# Patient Record
Sex: Male | Born: 1950 | Race: White | Hispanic: No | Marital: Married | Smoking: Former smoker
Health system: Southern US, Community
[De-identification: ages and names within clinical notes are randomized; demographics above are authoritative.]

## PROBLEM LIST (undated history)

## (undated) DIAGNOSIS — F329 Major depressive disorder, single episode, unspecified: Secondary | ICD-10-CM

## (undated) DIAGNOSIS — N529 Male erectile dysfunction, unspecified: Secondary | ICD-10-CM

## (undated) DIAGNOSIS — G4730 Sleep apnea, unspecified: Secondary | ICD-10-CM

## (undated) DIAGNOSIS — R001 Bradycardia, unspecified: Secondary | ICD-10-CM

## (undated) DIAGNOSIS — F419 Anxiety disorder, unspecified: Secondary | ICD-10-CM

## (undated) DIAGNOSIS — G9001 Carotid sinus syncope: Secondary | ICD-10-CM

## (undated) DIAGNOSIS — IMO0002 Reserved for concepts with insufficient information to code with codable children: Secondary | ICD-10-CM

## (undated) DIAGNOSIS — I10 Essential (primary) hypertension: Secondary | ICD-10-CM

## (undated) DIAGNOSIS — N2889 Other specified disorders of kidney and ureter: Secondary | ICD-10-CM

## (undated) DIAGNOSIS — F32A Depression, unspecified: Secondary | ICD-10-CM

## (undated) DIAGNOSIS — Z950 Presence of cardiac pacemaker: Secondary | ICD-10-CM

## (undated) DIAGNOSIS — C801 Malignant (primary) neoplasm, unspecified: Secondary | ICD-10-CM

## (undated) DIAGNOSIS — K219 Gastro-esophageal reflux disease without esophagitis: Secondary | ICD-10-CM

## (undated) DIAGNOSIS — N393 Stress incontinence (female) (male): Secondary | ICD-10-CM

## (undated) DIAGNOSIS — C61 Malignant neoplasm of prostate: Secondary | ICD-10-CM

## (undated) DIAGNOSIS — E785 Hyperlipidemia, unspecified: Secondary | ICD-10-CM

## (undated) DIAGNOSIS — M1990 Unspecified osteoarthritis, unspecified site: Secondary | ICD-10-CM

## (undated) DIAGNOSIS — T7840XA Allergy, unspecified, initial encounter: Secondary | ICD-10-CM

## (undated) HISTORY — DX: Anxiety disorder, unspecified: F41.9

## (undated) HISTORY — DX: Malignant (primary) neoplasm, unspecified: C80.1

## (undated) HISTORY — DX: Presence of cardiac pacemaker: Z95.0

## (undated) HISTORY — DX: Hyperlipidemia, unspecified: E78.5

## (undated) HISTORY — DX: Other specified disorders of kidney and ureter: N28.89

## (undated) HISTORY — DX: Allergy, unspecified, initial encounter: T78.40XA

## (undated) HISTORY — DX: Stress incontinence (female) (male): N39.3

## (undated) HISTORY — DX: Bradycardia, unspecified: R00.1

## (undated) HISTORY — DX: Unspecified osteoarthritis, unspecified site: M19.90

## (undated) HISTORY — DX: Male erectile dysfunction, unspecified: N52.9

## (undated) HISTORY — DX: Carotid sinus syncope: G90.01

## (undated) HISTORY — DX: Depression, unspecified: F32.A

## (undated) HISTORY — DX: Sleep apnea, unspecified: G47.30

## (undated) HISTORY — DX: Essential (primary) hypertension: I10

## (undated) HISTORY — DX: Gastro-esophageal reflux disease without esophagitis: K21.9

## (undated) HISTORY — DX: Reserved for concepts with insufficient information to code with codable children: IMO0002

## (undated) HISTORY — PX: PENILE PROSTHESIS IMPLANT: SHX240

## (undated) HISTORY — PX: COLONOSCOPY: SHX174

## (undated) HISTORY — DX: Malignant neoplasm of prostate: C61

## (undated) HISTORY — PX: POLYPECTOMY: SHX149

## (undated) HISTORY — DX: Major depressive disorder, single episode, unspecified: F32.9

## (undated) HISTORY — PX: PROSTATECTOMY: SHX69

---

## 2000-10-03 HISTORY — PX: SHOULDER SURGERY: SHX246

## 2001-07-10 ENCOUNTER — Encounter (INDEPENDENT_AMBULATORY_CARE_PROVIDER_SITE_OTHER): Payer: Self-pay

## 2001-07-10 ENCOUNTER — Other Ambulatory Visit: Admission: RE | Admit: 2001-07-10 | Discharge: 2001-07-10 | Payer: Self-pay | Admitting: Gastroenterology

## 2002-11-23 ENCOUNTER — Ambulatory Visit (HOSPITAL_BASED_OUTPATIENT_CLINIC_OR_DEPARTMENT_OTHER): Admission: RE | Admit: 2002-11-23 | Discharge: 2002-11-23 | Payer: Self-pay | Admitting: Neurology

## 2003-01-30 ENCOUNTER — Ambulatory Visit (HOSPITAL_BASED_OUTPATIENT_CLINIC_OR_DEPARTMENT_OTHER): Admission: RE | Admit: 2003-01-30 | Discharge: 2003-01-30 | Payer: Self-pay | Admitting: *Deleted

## 2004-03-13 ENCOUNTER — Emergency Department (HOSPITAL_COMMUNITY): Admission: EM | Admit: 2004-03-13 | Discharge: 2004-03-13 | Payer: Self-pay | Admitting: Emergency Medicine

## 2007-09-09 ENCOUNTER — Emergency Department (HOSPITAL_COMMUNITY): Admission: EM | Admit: 2007-09-09 | Discharge: 2007-09-09 | Payer: Self-pay | Admitting: *Deleted

## 2007-09-21 ENCOUNTER — Encounter: Admission: RE | Admit: 2007-09-21 | Discharge: 2007-09-21 | Payer: Self-pay | Admitting: Emergency Medicine

## 2007-10-04 HISTORY — PX: OTHER SURGICAL HISTORY: SHX169

## 2008-10-03 HISTORY — PX: ROTATOR CUFF REPAIR: SHX139

## 2009-03-11 ENCOUNTER — Encounter: Admission: RE | Admit: 2009-03-11 | Discharge: 2009-06-09 | Payer: Self-pay | Admitting: Orthopedic Surgery

## 2009-08-22 ENCOUNTER — Inpatient Hospital Stay (HOSPITAL_COMMUNITY): Admission: EM | Admit: 2009-08-22 | Discharge: 2009-08-25 | Payer: Self-pay | Admitting: Emergency Medicine

## 2009-11-07 ENCOUNTER — Emergency Department (HOSPITAL_COMMUNITY): Admission: EM | Admit: 2009-11-07 | Discharge: 2009-11-08 | Payer: Self-pay | Admitting: Emergency Medicine

## 2010-12-13 ENCOUNTER — Other Ambulatory Visit: Payer: Self-pay | Admitting: Family Medicine

## 2010-12-14 ENCOUNTER — Ambulatory Visit
Admission: RE | Admit: 2010-12-14 | Discharge: 2010-12-14 | Disposition: A | Discharge status: Home / Self Care | Payer: PRIVATE HEALTH INSURANCE | Source: Ambulatory Visit | Attending: Family Medicine | Admitting: Family Medicine

## 2010-12-14 ENCOUNTER — Other Ambulatory Visit: Payer: Self-pay | Admitting: Family Medicine

## 2010-12-14 DIAGNOSIS — R229 Localized swelling, mass and lump, unspecified: Secondary | ICD-10-CM

## 2010-12-23 LAB — DIFFERENTIAL
Basophils Relative: 0 % (ref 0–1)
Eosinophils Absolute: 0.4 10*3/uL (ref 0.0–0.7)
Eosinophils Relative: 4 % (ref 0–5)
Lymphocytes Relative: 27 % (ref 12–46)
Monocytes Absolute: 0.5 10*3/uL (ref 0.1–1.0)
Neutrophils Relative %: 63 % (ref 43–77)

## 2010-12-23 LAB — COMPREHENSIVE METABOLIC PANEL
ALT: 23 U/L (ref 0–53)
Albumin: 4.6 g/dL (ref 3.5–5.2)
Alkaline Phosphatase: 56 U/L (ref 39–117)
Calcium: 9.3 mg/dL (ref 8.4–10.5)
Glucose, Bld: 133 mg/dL — ABNORMAL HIGH (ref 70–99)
Total Protein: 7.2 g/dL (ref 6.0–8.3)

## 2010-12-23 LAB — URINALYSIS, ROUTINE W REFLEX MICROSCOPIC
Glucose, UA: NEGATIVE mg/dL
Hgb urine dipstick: NEGATIVE
Nitrite: NEGATIVE
Specific Gravity, Urine: 1.009 (ref 1.005–1.030)

## 2010-12-23 LAB — CBC
Hemoglobin: 14.9 g/dL (ref 13.0–17.0)
RBC: 4.85 MIL/uL (ref 4.22–5.81)
WBC: 9.6 10*3/uL (ref 4.0–10.5)

## 2010-12-23 LAB — POCT CARDIAC MARKERS: Troponin i, poc: <0.05 ng/mL (ref 0.00–0.09)

## 2010-12-23 LAB — URINE CULTURE: Colony Count: 6000

## 2010-12-23 LAB — LIPASE, BLOOD: Lipase: 25 U/L (ref 11–59)

## 2010-12-23 LAB — PROTIME-INR: Prothrombin Time: 12.8 seconds (ref 11.6–15.2)

## 2011-01-05 LAB — COMPREHENSIVE METABOLIC PANEL
AST: 21 U/L (ref 0–37)
CO2: 22 mEq/L (ref 19–32)
Calcium: 8.6 mg/dL (ref 8.4–10.5)
Creatinine, Ser: 1 mg/dL (ref 0.4–1.5)
GFR calc Af Amer: >60 mL/min (ref >60)
GFR calc non Af Amer: >60 mL/min (ref >60)

## 2011-01-05 LAB — GLUCOSE, CAPILLARY
Glucose-Capillary: 123 mg/dL — ABNORMAL HIGH (ref 70–99)
Glucose-Capillary: 130 mg/dL — ABNORMAL HIGH (ref 70–99)
Glucose-Capillary: 149 mg/dL — ABNORMAL HIGH (ref 70–99)
Glucose-Capillary: 160 mg/dL — ABNORMAL HIGH (ref 70–99)
Glucose-Capillary: 205 mg/dL — ABNORMAL HIGH (ref 70–99)
Glucose-Capillary: 81 mg/dL (ref 70–99)

## 2011-01-05 LAB — POCT CARDIAC MARKERS

## 2011-01-05 LAB — BASIC METABOLIC PANEL
CO2: 25 mEq/L (ref 19–32)
Calcium: 8.9 mg/dL (ref 8.4–10.5)
Chloride: 103 mEq/L (ref 96–112)
GFR calc Af Amer: >60 mL/min (ref >60)
Glucose, Bld: 136 mg/dL — ABNORMAL HIGH (ref 70–99)
Sodium: 136 mEq/L (ref 135–145)

## 2011-01-05 LAB — RAPID URINE DRUG SCREEN, HOSP PERFORMED
Barbiturates: NOT DETECTED
Benzodiazepines: NOT DETECTED
Cocaine: NOT DETECTED

## 2011-01-05 LAB — DIFFERENTIAL
Basophils Absolute: 0 10*3/uL (ref 0.0–0.1)
Eosinophils Absolute: 0.4 10*3/uL (ref 0.0–0.7)
Eosinophils Relative: 5 % (ref 0–5)
Monocytes Relative: 9 % (ref 3–12)
Neutrophils Relative %: 48 % (ref 43–77)

## 2011-01-05 LAB — CBC
HCT: 40.3 % (ref 39.0–52.0)
HCT: 41.8 % (ref 39.0–52.0)
Hemoglobin: 13.9 g/dL (ref 13.0–17.0)
Hemoglobin: 14.4 g/dL (ref 13.0–17.0)
MCHC: 34.4 g/dL (ref 30.0–36.0)
MCHC: 34.5 g/dL (ref 30.0–36.0)
MCV: 90.3 fL (ref 78.0–100.0)
MCV: 90.6 fL (ref 78.0–100.0)
MCV: 90.7 fL (ref 78.0–100.0)
Platelets: 192 10*3/uL (ref 150–400)
Platelets: 200 10*3/uL (ref 150–400)
Platelets: 202 10*3/uL (ref 150–400)
RBC: 4.36 MIL/uL (ref 4.22–5.81)
RBC: 4.63 MIL/uL (ref 4.22–5.81)
RBC: 4.67 MIL/uL (ref 4.22–5.81)
RDW: 12.8 % (ref 11.5–15.5)
RDW: 12.9 % (ref 11.5–15.5)
WBC: 7.9 10*3/uL (ref 4.0–10.5)
WBC: 8.1 10*3/uL (ref 4.0–10.5)

## 2011-01-05 LAB — HEPARIN LEVEL (UNFRACTIONATED)
Heparin Unfractionated: 0.1 IU/mL — ABNORMAL LOW (ref 0.30–0.70)
Heparin Unfractionated: 0.53 IU/mL (ref 0.30–0.70)
Heparin Unfractionated: 0.56 IU/mL (ref 0.30–0.70)
Heparin Unfractionated: 0.97 IU/mL — ABNORMAL HIGH (ref 0.30–0.70)
Heparin Unfractionated: <0.1 IU/mL — ABNORMAL LOW (ref 0.30–0.70)

## 2011-01-05 LAB — CARDIAC PANEL(CRET KIN+CKTOT+MB+TROPI)
CK, MB: 0.7 ng/mL (ref 0.3–4.0)
CK, MB: 0.7 ng/mL (ref 0.3–4.0)
CK, MB: 0.8 ng/mL (ref 0.3–4.0)
Relative Index: INVALID (ref 0.0–2.5)
Total CK: 61 U/L (ref 7–232)
Total CK: 65 U/L (ref 7–232)
Total CK: 67 U/L (ref 7–232)
Troponin I: 0.01 ng/mL (ref 0.00–0.06)
Troponin I: 0.01 ng/mL (ref 0.00–0.06)
Troponin I: 0.02 ng/mL (ref 0.00–0.06)

## 2011-01-05 LAB — LIPID PANEL
HDL: 40 mg/dL (ref >39)
LDL Cholesterol: 95 mg/dL (ref 0–99)
Triglycerides: 383 mg/dL — ABNORMAL HIGH (ref <150)

## 2011-01-05 LAB — POCT I-STAT, CHEM 8
BUN: 11 mg/dL (ref 6–23)
Calcium, Ion: 1.12 mmol/L (ref 1.12–1.32)
Creatinine, Ser: 0.9 mg/dL (ref 0.4–1.5)
Glucose, Bld: 215 mg/dL — ABNORMAL HIGH (ref 70–99)
TCO2: 22 mmol/L (ref 0–100)

## 2011-01-05 LAB — PROTIME-INR: INR: 0.9 (ref 0.00–1.49)

## 2011-01-05 LAB — CK TOTAL AND CKMB (NOT AT ARMC)
CK, MB: 0.9 ng/mL (ref 0.3–4.0)
Relative Index: INVALID (ref 0.0–2.5)
Total CK: 75 U/L (ref 7–232)

## 2011-01-05 LAB — TSH: TSH: 2.685 u[IU]/mL (ref 0.350–4.500)

## 2011-01-05 LAB — TROPONIN I: Troponin I: <0.01 ng/mL (ref 0.00–0.06)

## 2011-03-30 ENCOUNTER — Emergency Department (HOSPITAL_COMMUNITY): Payer: PRIVATE HEALTH INSURANCE

## 2011-03-30 ENCOUNTER — Inpatient Hospital Stay (HOSPITAL_COMMUNITY)
Admission: EM | Admit: 2011-03-30 | Discharge: 2011-04-01 | DRG: 244 | Disposition: A | Discharge status: Home / Self Care | Payer: PRIVATE HEALTH INSURANCE | Attending: Cardiovascular Disease | Admitting: Cardiovascular Disease

## 2011-03-30 DIAGNOSIS — E785 Hyperlipidemia, unspecified: Secondary | ICD-10-CM | POA: Diagnosis present

## 2011-03-30 DIAGNOSIS — E7800 Pure hypercholesterolemia, unspecified: Secondary | ICD-10-CM | POA: Diagnosis present

## 2011-03-30 DIAGNOSIS — F329 Major depressive disorder, single episode, unspecified: Secondary | ICD-10-CM | POA: Diagnosis present

## 2011-03-30 DIAGNOSIS — I2510 Atherosclerotic heart disease of native coronary artery without angina pectoris: Secondary | ICD-10-CM | POA: Diagnosis present

## 2011-03-30 DIAGNOSIS — G9001 Carotid sinus syncope: Secondary | ICD-10-CM | POA: Diagnosis present

## 2011-03-30 DIAGNOSIS — I10 Essential (primary) hypertension: Secondary | ICD-10-CM | POA: Diagnosis present

## 2011-03-30 DIAGNOSIS — F3289 Other specified depressive episodes: Secondary | ICD-10-CM | POA: Diagnosis present

## 2011-03-30 DIAGNOSIS — I498 Other specified cardiac arrhythmias: Principal | ICD-10-CM | POA: Diagnosis present

## 2011-03-30 DIAGNOSIS — E119 Type 2 diabetes mellitus without complications: Secondary | ICD-10-CM | POA: Diagnosis present

## 2011-03-30 DIAGNOSIS — R112 Nausea with vomiting, unspecified: Secondary | ICD-10-CM | POA: Diagnosis present

## 2011-03-30 DIAGNOSIS — I455 Other specified heart block: Secondary | ICD-10-CM | POA: Diagnosis present

## 2011-03-30 DIAGNOSIS — R55 Syncope and collapse: Secondary | ICD-10-CM | POA: Diagnosis present

## 2011-03-30 LAB — CK TOTAL AND CKMB (NOT AT ARMC)
CK, MB: 1.4 ng/mL (ref 0.3–4.0)
Relative Index: INVALID (ref 0.0–2.5)

## 2011-03-30 LAB — COMPREHENSIVE METABOLIC PANEL
AST: 18 U/L (ref 0–37)
Albumin: 4.2 g/dL (ref 3.5–5.2)
Alkaline Phosphatase: 53 U/L (ref 39–117)
BUN: 12 mg/dL (ref 6–23)
Chloride: 97 mEq/L (ref 96–112)
Potassium: 3.8 mEq/L (ref 3.5–5.1)
Sodium: 132 mEq/L — ABNORMAL LOW (ref 135–145)
Total Bilirubin: 0.3 mg/dL (ref 0.3–1.2)
Total Protein: 7.4 g/dL (ref 6.0–8.3)

## 2011-03-30 LAB — CBC
HCT: 42.6 % (ref 39.0–52.0)
Hemoglobin: 15.2 g/dL (ref 13.0–17.0)
MCV: 85.9 fL (ref 78.0–100.0)
RBC: 4.96 MIL/uL (ref 4.22–5.81)
RDW: 12.6 % (ref 11.5–15.5)
WBC: 12.3 10*3/uL — ABNORMAL HIGH (ref 4.0–10.5)

## 2011-03-30 LAB — GLUCOSE, CAPILLARY: Glucose-Capillary: 156 mg/dL — ABNORMAL HIGH (ref 70–99)

## 2011-03-30 LAB — TROPONIN I: Troponin I: <0.3 ng/mL (ref <0.30)

## 2011-03-30 LAB — MRSA PCR SCREENING: MRSA by PCR: NEGATIVE

## 2011-03-30 LAB — LIPASE, BLOOD: Lipase: 30 U/L (ref 11–59)

## 2011-03-31 HISTORY — PX: PACEMAKER INSERTION: SHX728

## 2011-03-31 LAB — BASIC METABOLIC PANEL
BUN: 8 mg/dL (ref 6–23)
Calcium: 7.9 mg/dL — ABNORMAL LOW (ref 8.4–10.5)
Chloride: 104 mEq/L (ref 96–112)
Creatinine, Ser: 0.8 mg/dL (ref 0.50–1.35)
GFR calc Af Amer: >60 mL/min (ref >60)

## 2011-03-31 LAB — CBC
HCT: 41 % (ref 39.0–52.0)
Platelets: 234 10*3/uL (ref 150–400)
RBC: 4.73 MIL/uL (ref 4.22–5.81)
RDW: 12.7 % (ref 11.5–15.5)
WBC: 11.8 10*3/uL — ABNORMAL HIGH (ref 4.0–10.5)

## 2011-03-31 LAB — PROTIME-INR: INR: 0.97 (ref 0.00–1.49)

## 2011-03-31 LAB — GLUCOSE, CAPILLARY

## 2011-04-01 ENCOUNTER — Inpatient Hospital Stay (HOSPITAL_COMMUNITY): Payer: PRIVATE HEALTH INSURANCE

## 2011-04-01 LAB — GLUCOSE, CAPILLARY: Glucose-Capillary: 161 mg/dL — ABNORMAL HIGH (ref 70–99)

## 2011-04-09 NOTE — Op Note (Signed)
Nathan Harding, ANTHES NO.:  [id number]  MEDICAL RECORD NO.:  [id number]  LOCATION:  2008                         FACILITY:  [facility]  PHYSICIAN:  Thurmon Fair, MD     DATE OF BIRTH:  1950/12/14  DATE OF PROCEDURE: DATE OF DISCHARGE:                              OPERATIVE REPORT   PROCEDURES PERFORMED: 1. Implantation of new dual-chamber permanent pacemaker. 2. Fluoroscopy. 3. Moderate sedation.  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 Marked bradycardia is easily reproduced by gentle palpation of the right carotid sinus.  He experienced recurrent syncope prior to this admission.  After risks and benefits of the procedure were described, the patient provided informed consent and was brought to the cardiac cath lab in a fasting state.  He was prepped and draped in usual sterile fashion. Local anesthesia with 1% lidocaine was administered to the left prepectoral area.  A 6-cm horizontal incision was made parallel to and roughly 3-4 cm inferior to the lower border of the left clavicle.  Using electrocautery and blunt dissection, a prepectoral pocket was carefully created with some methodical attention to providing excellent hemostasis.  Antibiotic-soaked sponge was placed in the pocket.  Under fluoroscopic guidance and using the modified Seldinger technique, 2 separate J-tipped guidewires were placed in the left subclavian vein and then exchanged for two 8-French safe sheaths.  The left ventricular lead was advanced to the level of the mid apical right ventricular septum.  The active fixation helix was deployed, but there was very poor current of injury.  A second attempt in the right ventricular apex did not provide adequate sensing and sensing parameters or current of injury.  Finally, a third location again in the mid-to- apical septal area appeared to  provide the best electronic parameters. The active fixation helix was deployed.  There was prominent current of injury.  There was a satisfactory sensing and pacing and impedance values.  Pacing at maximum device output did not produce any diaphragmatic/phrenic nerve stimulation.  The safe sheath was peeled away and the lead was secured in place using 2-0 silk.  In a similar fashion, the right atrial lead was advanced to the level of the appendage and the active fixation helix was deployed.  Prominent current of injury was seen.  There was good sensing, pacing, and impedance parameters.  The safe sheath was peeled away and the lead was secured in place using 2-0 silk.  The antibiotic-soaked sponge was removed from the pocket and the pocket was then flushed with copious amounts of antibiotic solution and reinspected for hemostasis.  The pacemaker generator was then connected to the leads and appropriate ventricular and subsequently atrioventricular sequential pacing were noted.  The generator and leads were placed in the pocket with great care being taken that the generator be located superficial to the leads.  The pocket was then closed in layers using 2 layers of 2-0 Vicryl as well as cutaneous staples, after which, a sterile dressing was applied.  No immediate complications occurred.  The estimated blood loss was less than 10 mL.  DEVICE DETAILS:  The device is  a Medtronic MRI safe Revo, model number RVDRO1, serial number [id number].  The right atrial lead is a Medtronic 5086 - 52 cm MRI safe lead, serial number [id number].  The ventricular lead is a Medtronic 5086 MRI safe - 58 cm lead, serial number [id number].  At the end of the procedure, the following electrical parameters were encountered.  Atrial lead sensed P-waves 2.1 mV, impedance 1013 ohms, threshold 1.5 V at 0.5 milliseconds pulse width.  By the retesting at the end of the procedure, threshold had dropped  to 1 volt at 0.4 milliseconds pulse width.  Right ventricular lead sensed R-waves were 12.4 mV, impedance 1280 ohms, threshold 1.5 V at 0.5 milliseconds pulse width.  By the end of the procedure, the threshold had dropped to 1 V at 0.4 milliseconds pulse width.  Medications administered in the procedure were Ancef 1 g intravenously, lidocaine 1% 30 mL locally, Versed 3 mg, fentanyl 50 mcg, and Zofran 4 mg intravenously.     Thurmon Fair, MD     MC/MEDQ  D:  03/31/2011  T:  04/01/2011  Job:  [id number]  cc:   Nanetta Batty, M.D. [facility]  Electronically Signed by Thurmon Fair M.D. on 04/09/2011 08:18:21 AM

## 2011-04-27 NOTE — Discharge Summary (Signed)
Nathan, [name]:  [id number]  MEDICAL RECORD NO.:  [id number]  LOCATION:  2008                         FACILITY:  [facility]  PHYSICIAN:  Nanetta Batty, M.D.   DATE OF BIRTH:  [birth year]  DATE OF ADMISSION:  03/30/2011 DATE OF DISCHARGE:  04/01/2011                              DISCHARGE SUMMARY   DISCHARGE DIAGNOSES: 1. Syncope secondary to severe bradycardia, sinus arrest with very     slow ventricular escape rhythm.     a.     Placement of a Medtronic permanent transvenous pacemaker      dual chamber to the left subclavian area on March 31, 2011.     b.     History of similar episode in 2010, but no documented      arrhythmias at that time were noted. 2. Carotid sinus hypersensitivity. 3. Diabetes mellitus 2, controlled. 4. Nonobstructive coronary artery disease in 2010. 5. Hypertension. 6. Dyslipidemia. 7. A 2-component friction rub.  A 2-D echo stable.  No pericardial     effusions.  DISCHARGE CONDITION:  Improved.  PROCEDURES:  Placement of Medtronic pacemaker dual chamber on March 31, 2011, by Dr. Rachelle Hora Croitoru.  During the procedure, it was noted the patient with carotid sinus compression produced immediate and dramatic bradycardia compatible with carotid sinus hypersensitivity.  DISCHARGE MEDICATIONS:  See medication reconciliation sheet.  DISCHARGE INSTRUCTIONS: 1. Increase activity slowly.  See pacemaker sheet for further     instructions. 2. Return to work after you see Dr. Royann Shivers. 3. Low-sodium, heart-healthy, diabetic diet. 4. Follow up with Dr. Royann Shivers for pacer site check on April 08, 2011,     at 2:45 p.m.  HOSPITAL COURSE:  A 60 year old white married male with history of normal cath in 2010 but presented with similar symptoms, but no monitoring was done initially.  Presented to the emergency room on March 30, 2011, with syncope.  He had no chest pain.  He called 911.  In the ER by ambulance, there were several episodes  associated with asystole and profound sinus brady.  He responded to IV atropine.  He was placed on IV dobutamine to keep his heart rate at a normal rate.  He was admitted to the intensive care unit for close monitoring.  ZOLL pads were applied.  Atropine at bedside.  He was seen by Dr. Royann Shivers on March 31, 2011, and placement of permanent transvenous pacemaker was completed without complications.  By the next morning, pacer site interrogation was within normal.  His chest x-ray showed no pneumothorax, but on exam, he had a 2-component friction rub.  A 2-D echo was done, but there was no pericardial effusion.  The patient ambulated in the hall and was ready for discharge.  LABORATORY VALUES:  Sodium 139, potassium 4.2, BUN 8, creatinine 0.80, glucose 155.  Hemoglobin 14.4, hematocrit 41, platelets 234, and WBC 11.8.  Protime 13.1, INR 0.97, total protein was 7.4, albumin 4.2, AST 18, ALT 22, alkaline phos was 53, total bili 0.3, lipase 30.  Cardiac enzymes, CK 61, MB 1.4, troponin I less than 0.30, and MRSA screen was negative.  Chest x-ray, a dual lead pacemaker placed via left subclavian with  lead tips overlying the right atrium and right ventricular contours.  Low lung volumes are noted.  Heart and mediastinal contours are stable. Heart size in upper lip limits of normal, but overall improvement in aeration is noted with bibasilar subsegmental atelectases.  Lung fields are clear with no sign of focal infiltrate or congestive failure.  No pleural fluid or peribronchial cuffing is noted.  A 2-D echo, EF 55-65%.  This was done on April 01, 2011.  There was some abnormal left ventricular relaxation, grade 1 diastolic dysfunction, mild pulmonic regurg, and there was a prominent pericardial fat pad extracardiac, but no pericardial effusions.  EKGs, sinus rhythm and normal P-waves.  The patient will follow up as instructed.     Darcella Gasman. Annie Paras,  N.P.   ______________________________ Nanetta Batty, M.D.    LRI/MEDQ  D:  04/07/2011  T:  04/08/2011  Job:  [id number]  cc:   Darrow Bussing, MD  Electronically Signed by Nada Boozer N.P. on 04/08/2011 04:32:45 PM Electronically Signed by Nanetta Batty M.D. on 04/27/2011 03:17:47 PM

## 2011-07-11 LAB — COMPREHENSIVE METABOLIC PANEL
Alkaline Phosphatase: 70
BUN: 16
Chloride: 99
Glucose, Bld: 211 — ABNORMAL HIGH
Potassium: 4.9
Total Bilirubin: 0.8

## 2011-07-11 LAB — DIFFERENTIAL
Basophils Absolute: 0
Basophils Relative: 0
Neutro Abs: 13.3 — ABNORMAL HIGH
Neutrophils Relative %: 91 — ABNORMAL HIGH

## 2011-07-11 LAB — CBC
HCT: 49.2
Hemoglobin: 16.9
RDW: 12.9
WBC: 14.7 — ABNORMAL HIGH

## 2011-07-11 LAB — KETONES, QUALITATIVE: Acetone, Bld: NEGATIVE

## 2011-07-25 ENCOUNTER — Encounter: Payer: Self-pay | Admitting: Gastroenterology

## 2011-08-04 ENCOUNTER — Ambulatory Visit (AMBULATORY_SURGERY_CENTER): Payer: PRIVATE HEALTH INSURANCE | Admitting: *Deleted

## 2011-08-04 VITALS — Ht 70.0 in | Wt 217.5 lb

## 2011-08-04 DIAGNOSIS — Z1211 Encounter for screening for malignant neoplasm of colon: Secondary | ICD-10-CM

## 2011-08-04 MED ORDER — PEG-KCL-NACL-NASULF-NA ASC-C 100 G PO SOLR: 1.0000 | Freq: Once | ORAL | Status: DC

## 2011-08-05 ENCOUNTER — Encounter: Payer: Self-pay | Admitting: Gastroenterology

## 2011-08-17 ENCOUNTER — Encounter: Payer: Self-pay | Admitting: Gastroenterology

## 2011-08-17 ENCOUNTER — Ambulatory Visit (AMBULATORY_SURGERY_CENTER): Payer: PRIVATE HEALTH INSURANCE | Admitting: Gastroenterology

## 2011-08-17 VITALS — BP 129/83 | HR 78 | Temp 96.6°F | Resp 16 | Ht 70.0 in | Wt 217.0 lb

## 2011-08-17 DIAGNOSIS — D126 Benign neoplasm of colon, unspecified: Secondary | ICD-10-CM

## 2011-08-17 DIAGNOSIS — Z1211 Encounter for screening for malignant neoplasm of colon: Secondary | ICD-10-CM

## 2011-08-17 MED ORDER — SODIUM CHLORIDE 0.9 % IV SOLN: 500.0000 mL | INTRAVENOUS | Status: DC

## 2011-08-17 NOTE — Progress Notes (Signed)
Patient did not experience any of the following events: a burn prior to discharge; a fall within the facility; wrong site/side/patient/procedure/implant event; or a hospital transfer or hospital admission upon discharge from the facility. (G8907) Patient did not have preoperative order for IV antibiotic SSI prophylaxis. (G8918)  

## 2011-08-17 NOTE — Patient Instructions (Signed)
Green and blue discharge instructions reviewed with patient and care partner.  Impressions/recommendations:  Polyps (handout given)  Repeat colonoscopy in 3 years.  Resume medications as you were taking them prior to your procedure.

## 2011-08-18 ENCOUNTER — Telehealth: Payer: Self-pay

## 2011-08-18 NOTE — Telephone Encounter (Signed)
Left message on answering machine. 

## 2011-08-23 ENCOUNTER — Encounter: Payer: Self-pay | Admitting: Gastroenterology

## 2011-10-04 DIAGNOSIS — C61 Malignant neoplasm of prostate: Secondary | ICD-10-CM

## 2011-10-04 HISTORY — DX: Malignant neoplasm of prostate: C61

## 2011-12-13 ENCOUNTER — Emergency Department (HOSPITAL_COMMUNITY)
Admission: EM | Admit: 2011-12-13 | Discharge: 2011-12-13 | Disposition: A | Discharge status: Home Health | Payer: PRIVATE HEALTH INSURANCE | Attending: Emergency Medicine | Admitting: Emergency Medicine

## 2011-12-13 ENCOUNTER — Other Ambulatory Visit: Payer: Self-pay

## 2011-12-13 ENCOUNTER — Emergency Department (HOSPITAL_COMMUNITY): Payer: PRIVATE HEALTH INSURANCE

## 2011-12-13 ENCOUNTER — Encounter (HOSPITAL_COMMUNITY): Payer: Self-pay | Admitting: *Deleted

## 2011-12-13 DIAGNOSIS — E785 Hyperlipidemia, unspecified: Secondary | ICD-10-CM | POA: Insufficient documentation

## 2011-12-13 DIAGNOSIS — R109 Unspecified abdominal pain: Secondary | ICD-10-CM | POA: Insufficient documentation

## 2011-12-13 DIAGNOSIS — Z9889 Other specified postprocedural states: Secondary | ICD-10-CM | POA: Insufficient documentation

## 2011-12-13 DIAGNOSIS — E119 Type 2 diabetes mellitus without complications: Secondary | ICD-10-CM | POA: Insufficient documentation

## 2011-12-13 DIAGNOSIS — I10 Essential (primary) hypertension: Secondary | ICD-10-CM | POA: Insufficient documentation

## 2011-12-13 DIAGNOSIS — R1013 Epigastric pain: Secondary | ICD-10-CM | POA: Insufficient documentation

## 2011-12-13 DIAGNOSIS — R300 Dysuria: Secondary | ICD-10-CM | POA: Insufficient documentation

## 2011-12-13 DIAGNOSIS — G4730 Sleep apnea, unspecified: Secondary | ICD-10-CM | POA: Insufficient documentation

## 2011-12-13 DIAGNOSIS — K219 Gastro-esophageal reflux disease without esophagitis: Secondary | ICD-10-CM | POA: Insufficient documentation

## 2011-12-13 DIAGNOSIS — R55 Syncope and collapse: Secondary | ICD-10-CM

## 2011-12-13 LAB — COMPREHENSIVE METABOLIC PANEL
ALT: 17 U/L (ref 0–53)
Alkaline Phosphatase: 46 U/L (ref 39–117)
GFR calc Af Amer: 79 mL/min — ABNORMAL LOW (ref >90)
Glucose, Bld: 139 mg/dL — ABNORMAL HIGH (ref 70–99)
Potassium: 4.1 mEq/L (ref 3.5–5.1)
Sodium: 135 mEq/L (ref 135–145)
Total Protein: 6.6 g/dL (ref 6.0–8.3)

## 2011-12-13 LAB — CBC
MCH: 30.2 pg (ref 26.0–34.0)
Platelets: 239 10*3/uL (ref 150–400)
RBC: 4.21 MIL/uL — ABNORMAL LOW (ref 4.22–5.81)
WBC: 13.9 10*3/uL — ABNORMAL HIGH (ref 4.0–10.5)

## 2011-12-13 LAB — URINALYSIS, ROUTINE W REFLEX MICROSCOPIC
Glucose, UA: NEGATIVE mg/dL
Ketones, ur: 15 mg/dL — AB
Nitrite: NEGATIVE
pH: 5.5 (ref 5.0–8.0)

## 2011-12-13 LAB — DIFFERENTIAL
Eosinophils Absolute: 0.3 10*3/uL (ref 0.0–0.7)
Lymphocytes Relative: 18 % (ref 12–46)
Lymphs Abs: 2.5 10*3/uL (ref 0.7–4.0)
Neutrophils Relative %: 73 % (ref 43–77)

## 2011-12-13 MED ORDER — SODIUM CHLORIDE 0.9 % IV BOLUS (SEPSIS)
1000.0000 mL | Freq: Once | INTRAVENOUS | Status: AC
  Administered 2011-12-13: 1000 mL via INTRAVENOUS

## 2011-12-13 NOTE — Discharge Instructions (Signed)

## 2011-12-13 NOTE — ED Notes (Signed)
Pt was at home and had increased vesicare to twice a day.  Pt bent over and felt sharp pain in left upper abdomen and had near syncopal event and had been diaphoretic.  Pt states the pain radiated to mid upper abdomen and into chest and patient stated the pain was overwhelming.  PT bp has been in the 80s systolic and has had NS 400cc en route.  [id number].  Pt has a pacer that was placed for carotid sinus syndrome.

## 2011-12-13 NOTE — ED Provider Notes (Signed)
History     CSN: [id number]  Arrival date & time 12/13/11  Nathan Harding   First MD Initiated Contact with Patient 12/13/11 1822      Chief Complaint  Patient presents with  . Near Syncope  . Abdominal Pain    radiation to burning in the chest    (Consider location/radiation/quality/duration/timing/severity/associated sxs/prior treatment) Patient is a 61 y.o. male presenting with abdominal pain. The history is provided by the patient.  Abdominal Pain The primary symptoms of the illness include abdominal pain. The primary symptoms of the illness do not include shortness of breath, nausea or vomiting. The current episode started 3 to 5 hours ago. The onset of the illness was gradual. The problem has been resolved.  Associated with: Patient states the abdominal pain started when he restarted pick up something by his left foot. The pain started in the left mid abdomen and radiated to the epigastrium. It was severe lasting less than 5 minutes. The patient has not had a change in bowel habit. Symptoms associated with the illness do not include chills, heartburn or back pain. Associated symptoms comments: Patient does endorse dysuria. He is status post prostatectomy a month ago. Urinary catheter was removed on 2/21. Since then has had dysuria. He completed one week of Cipro a week ago. Was given another one time only antibiotic which he took today..    Past Medical History  Diagnosis Date  . Allergy   . Anxiety   . Arthritis   . Cancer     skin- Basal Cell CA  . Diabetes mellitus   . GERD (gastroesophageal reflux disease)   . Hyperlipidemia   . Hypertension   . Ulcer   . Sleep apnea     uses CPAP    Past Surgical History  Procedure Date  . Pacemaker insertion     due to hypersensitive carotid sinus syndrome  . Colonoscopy   . Temporal mastoidectomy     behind left ear  . Rotator cuff repair     right  . Shoulder surgery     right    Family History  Problem Relation Age of Onset    . Colon cancer Neg Hx   . Stomach cancer Neg Hx   . Esophageal cancer Cousin     History  Substance Use Topics  . Smoking status: Former Games developer  . Smokeless tobacco: Not on file  . Alcohol Use: 3.6 oz/week    6 Cans of beer per week     no drank in one month  -  updated 12/12/11      Review of Systems  Constitutional: Negative for chills.  Respiratory: Negative for shortness of breath.   Cardiovascular: Negative for chest pain.  Gastrointestinal: Positive for abdominal pain. Negative for heartburn, nausea and vomiting.  Musculoskeletal: Negative for back pain.  Neurological: Positive for dizziness and light-headedness. Negative for syncope.  All other systems reviewed and are negative.    Allergies  Review of patient's allergies indicates no known allergies.  Home Medications   Current Outpatient Rx  Name Route Sig Dispense Refill  . CARVEDILOL 6.25 MG PO TABS Oral Take 6.25 mg by mouth 2 (two) times daily with a meal.      . ESOMEPRAZOLE MAGNESIUM 40 MG PO CPDR Oral Take 40 mg by mouth daily before breakfast.    . FENOFIBRATE 145 MG PO TABS Oral Take 145 mg by mouth daily.      Marland Kitchen FEXOFENADINE HCL 180 MG PO TABS Oral  Take 180 mg by mouth daily.      Marland Kitchen FLUTICASONE PROPIONATE 50 MCG/ACT NA SUSP Nasal Place 2 sprays into the nose daily.      Marland Kitchen LISINOPRIL 20 MG PO TABS Oral Take 20 mg by mouth daily.    Marland Kitchen METFORMIN HCL 1000 MG PO TABS Oral Take 1,000 mg by mouth 2 (two) times daily with a meal.     . OMEGA-3-ACID ETHYL ESTERS 1 G PO CAPS Oral Take 2 g by mouth 2 (two) times daily.      Marland Kitchen PAROXETINE HCL 30 MG PO TABS Oral Take 30 mg by mouth every morning.      Marland Kitchen ROSUVASTATIN CALCIUM 20 MG PO TABS Oral Take 20 mg by mouth daily.      Marland Kitchen SOLIFENACIN SUCCINATE 5 MG PO TABS Oral Take 5 mg by mouth daily.       BP 117/72  Pulse 82  Temp(Src) 98.8 F (37.1 C) (Oral)  Resp 13  SpO2 98%  Physical Exam  Nursing note and vitals reviewed. Constitutional: He is oriented to  person, place, and time. He appears well-developed and well-nourished. No distress.  HENT:  Head: Normocephalic and atraumatic.  Right Ear: External ear normal.  Left Ear: External ear normal.  Mouth/Throat: Oropharynx is clear and moist.  Eyes: Pupils are equal, round, and reactive to light.  Neck: Normal range of motion. Neck supple.  Cardiovascular: Normal rate, regular rhythm, normal heart sounds and intact distal pulses.  Exam reveals no gallop and no friction rub.   No murmur heard. Pulmonary/Chest: Effort normal and breath sounds normal. No respiratory distress. He has no wheezes. He has no rales.       4 small laparoscopic incisions noted in lower abdomen. They appear to be well healing. They're nontender. No fluctuance or induration noted.  Abdominal: Soft. There is no tenderness. There is no rebound and no guarding.  Musculoskeletal: Normal range of motion. He exhibits no edema and no tenderness.  Lymphadenopathy:    He has no cervical adenopathy.  Neurological: He is alert and oriented to person, place, and time.       5/5 strength in all extremities.  Skin: Skin is warm and dry. No rash noted. No erythema.  Psychiatric: He has a normal mood and affect. His behavior is normal.    ED Course  Procedures (including critical care time)  Results for orders placed during the hospital encounter of 12/13/11  CBC      Component Value Range   WBC 13.9 (*) 4.0 - 10.5 (K/uL)   RBC 4.21 (*) 4.22 - 5.81 (MIL/uL)   Hemoglobin 12.7 (*) 13.0 - 17.0 (g/dL)   HCT 78.4 (*) 69.6 - 52.0 (%)   MCV 87.4  78.0 - 100.0 (fL)   MCH 30.2  26.0 - 34.0 (pg)   MCHC 34.5  30.0 - 36.0 (g/dL)   RDW 29.5  28.4 - 13.2 (%)   Platelets 239  150 - 400 (K/uL)  DIFFERENTIAL      Component Value Range   Neutrophils Relative 73  43 - 77 (%)   Neutro Abs 10.2 (*) 1.7 - 7.7 (K/uL)   Lymphocytes Relative 18  12 - 46 (%)   Lymphs Abs 2.5  0.7 - 4.0 (K/uL)   Monocytes Relative 6  3 - 12 (%)   Monocytes  Absolute 0.9  0.1 - 1.0 (K/uL)   Eosinophils Relative 2  0 - 5 (%)   Eosinophils Absolute 0.3  0.0 -  0.7 (K/uL)   Basophils Relative 0  0 - 1 (%)   Basophils Absolute 0.0  0.0 - 0.1 (K/uL)  COMPREHENSIVE METABOLIC PANEL      Component Value Range   Sodium 135  135 - 145 (mEq/L)   Potassium 4.1  3.5 - 5.1 (mEq/L)   Chloride 102  96 - 112 (mEq/L)   CO2 20  19 - 32 (mEq/L)   Glucose, Bld 139 (*) 70 - 99 (mg/dL)   BUN 19  6 - 23 (mg/dL)   Creatinine, Ser 1.61  0.50 - 1.35 (mg/dL)   Calcium 9.3  8.4 - 09.6 (mg/dL)   Total Protein 6.6  6.0 - 8.3 (g/dL)   Albumin 3.7  3.5 - 5.2 (g/dL)   AST 15  0 - 37 (U/L)   ALT 17  0 - 53 (U/L)   Alkaline Phosphatase 46  39 - 117 (U/L)   Total Bilirubin 0.2 (*) 0.3 - 1.2 (mg/dL)   GFR calc non Af Amer 68 (*) >90 (mL/min)   GFR calc Af Amer 79 (*) >90 (mL/min)  URINALYSIS, ROUTINE W REFLEX MICROSCOPIC      Component Value Range   Color, Urine YELLOW  YELLOW    APPearance CLOUDY (*) CLEAR    Specific Gravity, Urine 1.025  1.005 - 1.030    pH 5.5  5.0 - 8.0    Glucose, UA NEGATIVE  NEGATIVE (mg/dL)   Hgb urine dipstick NEGATIVE  NEGATIVE    Bilirubin Urine SMALL (*) NEGATIVE    Ketones, ur 15 (*) NEGATIVE (mg/dL)   Protein, ur 045 (*) NEGATIVE (mg/dL)   Urobilinogen, UA 1.0  0.0 - 1.0 (mg/dL)   Nitrite NEGATIVE  NEGATIVE    Leukocytes, UA MODERATE (*) NEGATIVE   URINE MICROSCOPIC-ADD ON      Component Value Range   Squamous Epithelial / LPF RARE  RARE    WBC, UA 21-50  <3 (WBC/hpf)   RBC / HPF 0-2  <3 (RBC/hpf)   Bacteria, UA FEW (*) RARE    Casts HYALINE CASTS (*) NEGATIVE    Urine-Other MUCOUS PRESENT      Dg Chest 2 View  12/13/2011  *RADIOLOGY REPORT*  Clinical Data: Near syncope.  Abdominal pain.  Question reaction to medication.  Low blood pressure.  Dizziness.  History of pacemaker, diabetes.  CHEST - 2 VIEW  Comparison: 04/01/2011  Findings: The patient has left-sided transvenous pacemaker with leads to the right atrium and right  ventricle.  Heart is upper limits normal in size.  Mild perihilar peribronchial thickening is noted.  Streaky densities are identified at the lung bases which are possibly chronic.  There are no focal consolidations or pleural effusions.  Mild degenerative changes are seen in the lower thoracic spine.  IMPRESSION:  1.  Mild bronchitic changes. 2. No focal pulmonary abnormality.  Original Report Authenticated By: Patterson Hammersmith, M.D.   Imaging independently viewed by me, interpreted by radiologist.  1. Abdominal pain   2. Vasovagal near syncope       MDM  76:32 PM 61 year old male with a history of diabetes, hypertension, hyperlipidemia, carotid sinus hypersensitivity status post pacemaker and prostate cancer status post prostatectomy roughly a month ago presenting with abdominal pain and near-syncope. Patient states that he was at home and bent over. He had sudden onset left-sided abdominal pain rating to his epigastrium that was severe with associated lightheadedness, dyspnea, and diaphoresis and feeling clammy. He states the symptoms were similar to his near syncope symptoms  he had in the past with his carotid hypersensitivity. Patient states he had his urinary catheter removed on 2/21. Since then he has had dysuria. He completed a course of Cipro a week ago. He was given a one time antibiotic today. He has had no nausea, vomiting, diarrhea or fever. He denies chest pain or shortness of breath. Symptoms certainly sound to be vasovagal. The abdominal pain lasted less than 5 minutes and has now completely resolved. His abdomen is nontender. Laparoscopic surgical incisions are noted which do not appear infected. Will check cbc, cmp, cxr and reassess.   10:31 PM Labs without significant abnormality. UA is infected, but pt was treated earlier today with antibiotic containing phosphomycin. This would not have time to affect urinalysis yet. Given this will not treat at this point. Vitals have remained  stable. This was likely a vasovagal episode secondary to pain. Doubt any serious intraabdominal pathology. Abdominal pain lasted less than 5 minutes and completely resolved. His abdomen is non-tender. This is not consistent with an acute intraabdominal process. Leukocytosis likely secondary to UTI. Discussed with patient and wife. They will f/u with urologist tomorrow. Will continue to follow urine culture, however he did take antibiotic today. Patient was given strong return precautions and dc'd home in stable condition.         Sheran Luz, MD 12/13/11 2245

## 2011-12-13 NOTE — ED Notes (Signed)
Pt presents to department for evaluation of near syncope and abdominal pain. Pt states he is being treated for UTI, increased dose of vesicare yesterday. Pt states he also took another antibiotic today (unsure of name). States he bent down today when he felt sharp L sided pain. Also states he became cool, clammy and dizzy. Denies LOC. He is conscious alert and oriented x4. Denies chest pain. Respirations unlabored. No signs of distress noted at the time.

## 2011-12-13 NOTE — ED Notes (Signed)
Pt in gown, hooked up to monitor and resting at this time.

## 2011-12-14 NOTE — ED Provider Notes (Signed)
I saw and evaluated the patient, reviewed the resident's note and I agree with the findings and plan.   .Face to face Exam:  General:  Awake HEENT:  Atraumatic Resp:  Normal effort Abd:  Nondistended Neuro:No focal weakness Lymph: No adenopathy   Arwin Bisceglia L Benecio Kluger, MD 12/14/11 2213 

## 2011-12-16 LAB — URINE CULTURE
Colony Count: 40000
Culture  Setup Time: 201303130324

## 2011-12-17 NOTE — ED Notes (Signed)
Chart sent to EDP office for review °

## 2011-12-18 NOTE — ED Notes (Addendum)
Chart back from Edp office.Marland Kitchen Rx called into [facility] [id number]  Written by Fayrene Helper for  Keflex 500 mg of TID x 7 days for treatment of UTI.

## 2012-02-02 ENCOUNTER — Other Ambulatory Visit: Payer: Self-pay | Admitting: Dermatology

## 2012-03-10 IMAGING — CR DG CHEST 2V
2 series · 2 of 2 positions shown · non-contrast
Comparison: 03/30/2011

CLINICAL DATA: Post pacemaker insertion.  History of syncope.

CHEST - 2 VIEW

[w chest pa]
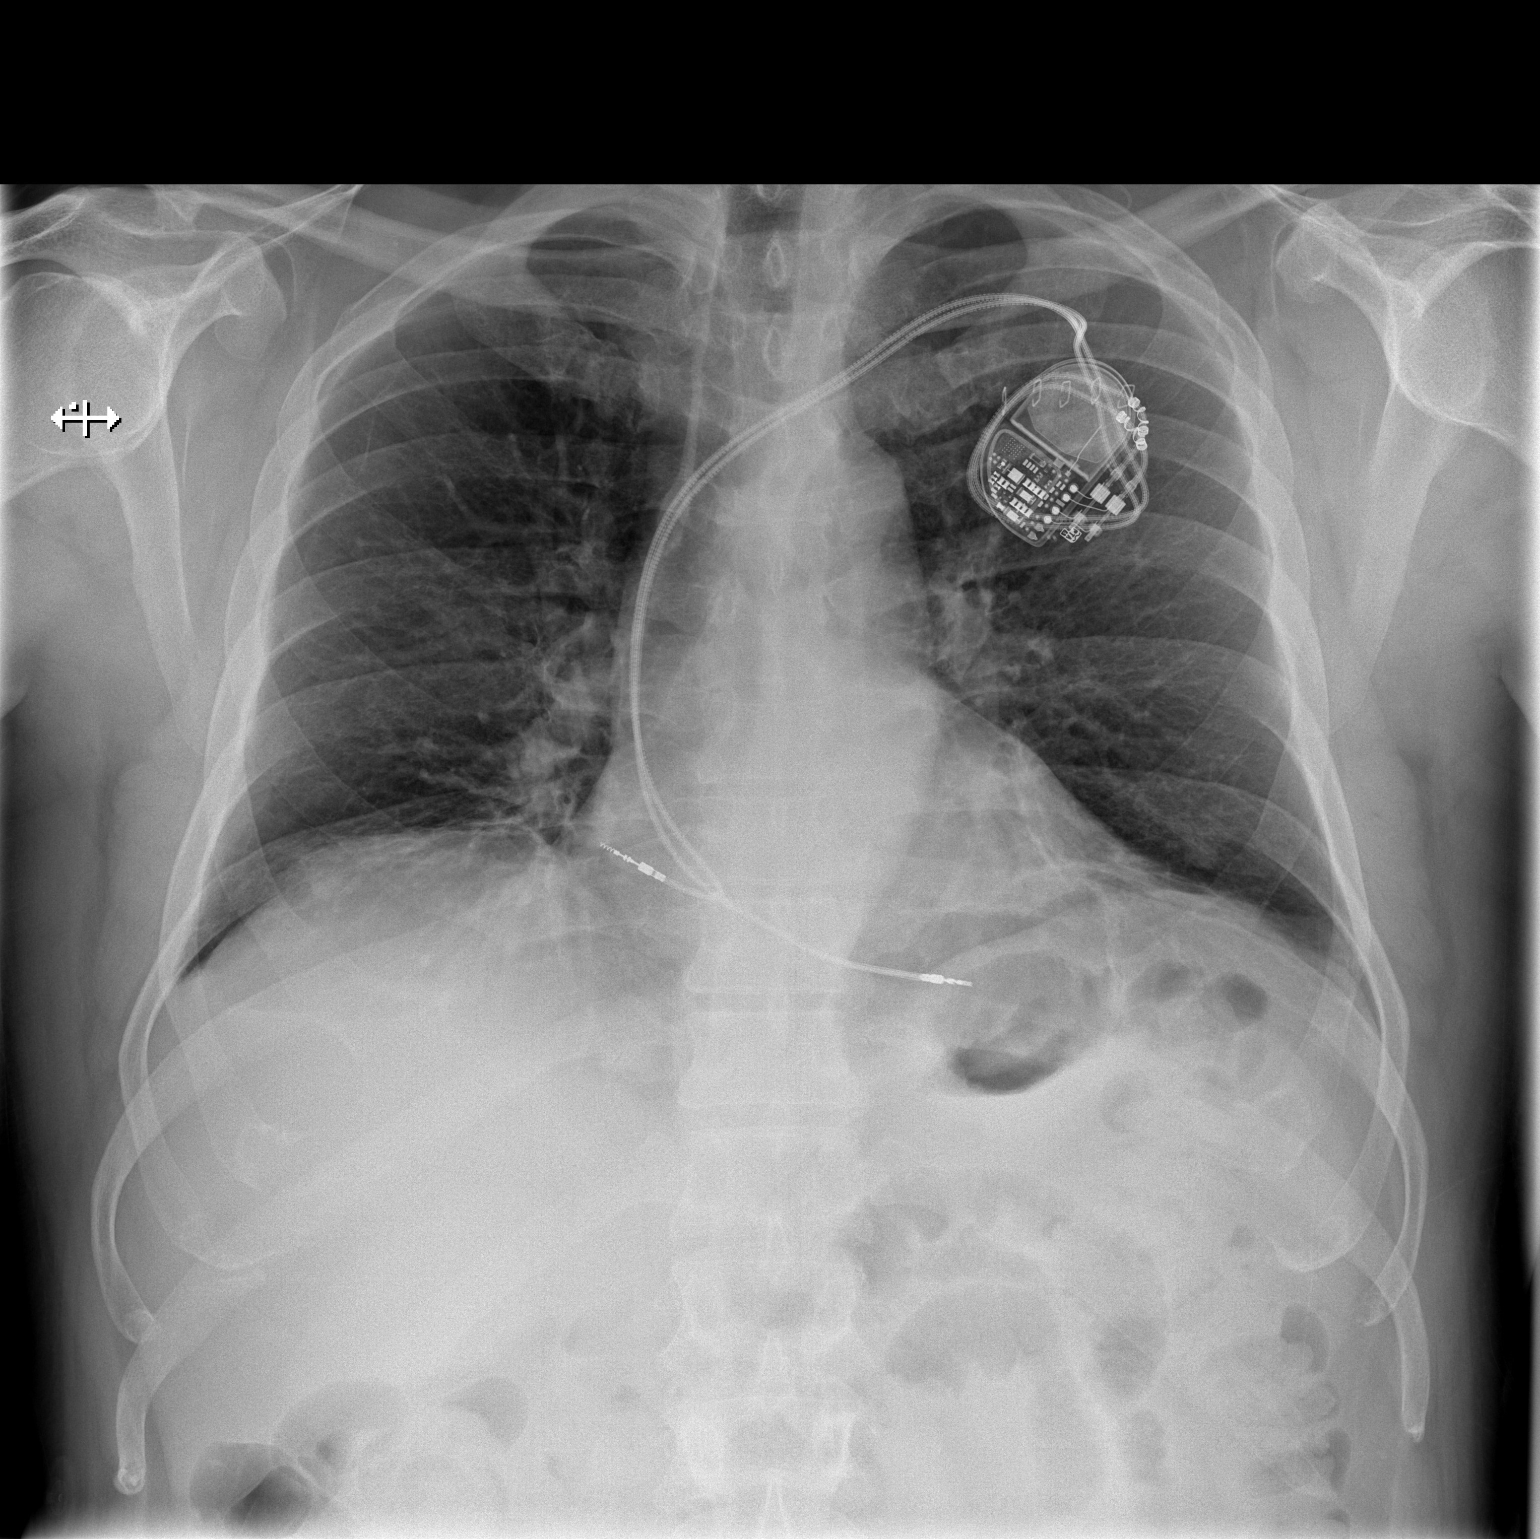

[w chest lat]
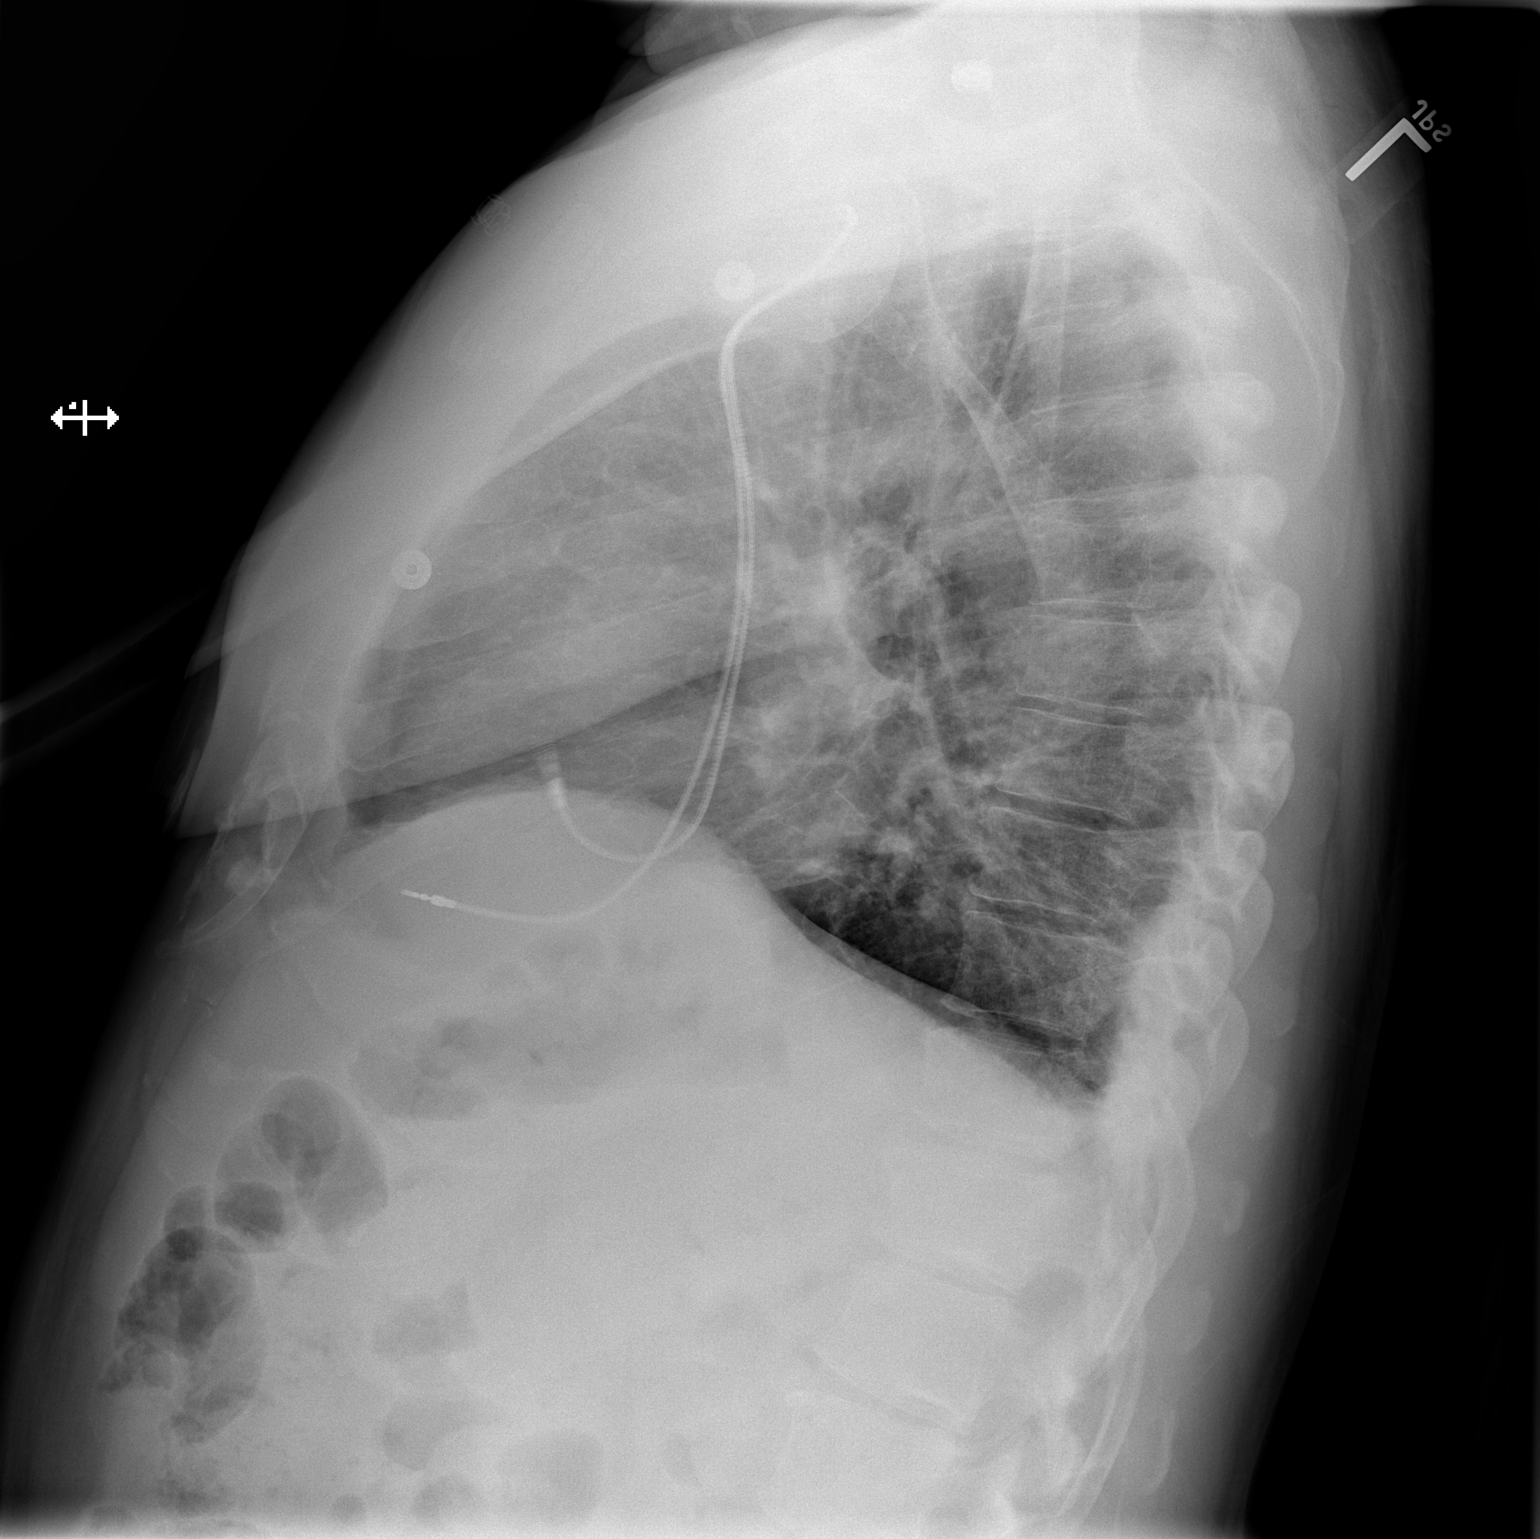

[2 of 2 positions shown; findings below may reference images not displayed]

FINDINGS: A dual lead pacemaker has been placed via a left
subclavian approach with lead tips are identified overlying the
right atrium and right ventricular contours.

Low lung volumes are noted and taking this into consideration heart
and mediastinal contours are stable with heart size at the upper
limits of normal. Overall improvement in aeration is noted with
bibasilar subsegmental atelectasis.

The lung fields are otherwise clear with no signs of focal
infiltrate or congestive failure.  No pleural fluid or
peribronchial cuffing is noted.

Bony structures are notable for synostosis of the right distal
clavicle to the acromion with post-traumatic or postsurgical
changes of the distal right clavicle.
IMPRESSION: Overall improvement in aeration with bibasilar subsegmental
atelectasis noted post pacer placement.

## 2012-04-19 ENCOUNTER — Other Ambulatory Visit: Payer: Self-pay | Admitting: Dermatology

## 2012-12-31 ENCOUNTER — Encounter: Payer: Self-pay | Admitting: *Deleted

## 2013-01-01 ENCOUNTER — Encounter: Payer: Self-pay | Admitting: Cardiovascular Disease

## 2013-01-21 ENCOUNTER — Encounter: Payer: Self-pay | Admitting: *Deleted

## 2013-02-06 ENCOUNTER — Other Ambulatory Visit (HOSPITAL_COMMUNITY): Payer: Self-pay | Admitting: Cardiovascular Disease

## 2013-02-06 ENCOUNTER — Ambulatory Visit (HOSPITAL_COMMUNITY)
Admission: RE | Admit: 2013-02-06 | Discharge: 2013-02-06 | Disposition: A | Discharge status: Home / Self Care | Payer: PRIVATE HEALTH INSURANCE | Source: Ambulatory Visit | Attending: Cardiovascular Disease | Admitting: Cardiovascular Disease

## 2013-02-06 DIAGNOSIS — I10 Essential (primary) hypertension: Secondary | ICD-10-CM | POA: Insufficient documentation

## 2013-02-06 DIAGNOSIS — E669 Obesity, unspecified: Secondary | ICD-10-CM | POA: Insufficient documentation

## 2013-02-06 DIAGNOSIS — R0989 Other specified symptoms and signs involving the circulatory and respiratory systems: Secondary | ICD-10-CM | POA: Insufficient documentation

## 2013-02-06 DIAGNOSIS — R0609 Other forms of dyspnea: Secondary | ICD-10-CM | POA: Insufficient documentation

## 2013-02-06 DIAGNOSIS — R079 Chest pain, unspecified: Secondary | ICD-10-CM

## 2013-02-06 DIAGNOSIS — E119 Type 2 diabetes mellitus without complications: Secondary | ICD-10-CM | POA: Insufficient documentation

## 2013-02-06 MED ORDER — TECHNETIUM TC 99M SESTAMIBI GENERIC - CARDIOLITE: 30.4000 | Freq: Once | INTRAVENOUS | Status: AC | PRN

## 2013-02-06 MED ORDER — TECHNETIUM TC 99M SESTAMIBI GENERIC - CARDIOLITE: 10.5000 | Freq: Once | INTRAVENOUS | Status: AC | PRN

## 2013-02-06 NOTE — Procedures (Addendum)
[name] [address] [place] [address] [phone]  Cardiology Nuclear Med Study  Nathan Harding is a 62 y.o. male     MRN : [id number]     DOB: 11/07/1950  Procedure Date: 02/06/2013  Nuclear Med Background Indication for Stress Test:  PTCA Patency History:  cad;ptca--08/24/2009;pacer--12/20/12 Cardiac Risk Factors: Family History - CAD, History of Smoking, Hypertension, Lipids, NIDDM and Obesity  Symptoms:  Chest Pain and DOE   Nuclear Pre-Procedure Caffeine/Decaff Intake:  8:00pm NPO After: 6:00am   IV Site: R Hand  IV 0.9% NS with Angio Cath:  22g  Chest Size (in):  46"  IV Started by: Emmit Pomfret, RN  Height: 5\' 10"  (1.778 m)  Cup Size: n/a  BMI:  Body mass index is 31.57 kg/(m^2). Weight:  220 lb (99.791 kg)   Tech Comments:  N/A    Nuclear Med Study 1 or 2 day study: 1 day  Stress Test Type:  Stress  Order Authorizing Provider:  MIHAI CROITORU,MD   Resting Radionuclide: Technetium 70m Sestamibi  Resting Radionuclide Dose: 10.5 mCi   Stress Radionuclide:  Technetium 50m Sestamibi  Stress Radionuclide Dose: 30.4 mCi           Stress Protocol Rest HR: 71 Stress HR: 155  Rest BP: 136/88 Stress BP: 198/92  Exercise Time (min): 6:30 METS: 7.0   Predicted Max HR: 159 bpm % Max HR: 97.48 bpm Rate Pressure Product: 21308  Dose of Adenosine (mg):  n/a Dose of Lexiscan: n/a mg  Dose of Atropine (mg): n/a Dose of Dobutamine: n/a mcg/kg/min (at max HR)  Stress Test Technologist: Esperanza Sheets, CCT Nuclear Technologist: Gonzella Lex, CNMT   Rest Procedure:  Myocardial perfusion imaging was performed at rest 45 minutes following the intravenous administration of Technetium 59m Sestamibi. Stress Procedure:  The patient performed treadmill exercise using a Bruce  Protocol for 6:30 minutes. The patient stopped due to SOB and denied any chest pain.  There were no significant ST-T wave changes.  Technetium  60m Sestamibi was injected at peak exercise and myocardial perfusion imaging was performed after a brief delay.  Transient Ischemic Dilatation (Normal <1.22):  0.99 Lung/Heart Ratio (Normal <0.45):  0.33 QGS EDV:  62 ml QGS ESV:  20 ml LV Ejection Fraction: 68%  Rest ECG: NSR with Q waves in V1-V3  Stress ECG: No significant ST segment change suggestive of ischemia.  QPS Raw Data Images:  Normal; no motion artifact; normal heart/lung ratio. Stress Images:  small basal inferior bowel artifact Rest Images:  basal inferior bowel artifact Subtraction (SDS):  No evidence of ischemia.  Impression Exercise Capacity:  Good exercise capacity. BP Response:  Hypertensive blood pressure response. Clinical Symptoms:  There is dyspnea. ECG Impression:  No significant ST segment change suggestive of ischemia. Comparison with Prior Nuclear Study: No previous nuclear study performed  Overall Impression:  Low risk stress nuclear study mild basal inferior bowel artifact. No ischemia..Hypertensive response to exercise   LV Wall Motion:  NL LV Function; NL Wall Motion; EF 68%.  Nathan Nose, MD, [facility] Board Certified in Nuclear Cardiology Attending Cardiologist [facility]  Nathan Nose, MD  02/06/2013 2:51 PM

## 2013-03-25 ENCOUNTER — Other Ambulatory Visit: Payer: Self-pay | Admitting: Cardiovascular Disease

## 2013-03-25 DIAGNOSIS — I498 Other specified cardiac arrhythmias: Secondary | ICD-10-CM

## 2013-03-25 LAB — PACEMAKER DEVICE OBSERVATION

## 2013-03-26 ENCOUNTER — Telehealth: Payer: Self-pay | Admitting: Cardiovascular Disease

## 2013-03-26 NOTE — Telephone Encounter (Signed)
He did pacemaker check from home last night-he wants to know if it transmitted?

## 2013-03-26 NOTE — Telephone Encounter (Signed)
Message sent to  [facility]

## 2013-03-26 NOTE — Telephone Encounter (Signed)
Patient informed that pacemaker transmission was received.

## 2013-03-29 ENCOUNTER — Encounter: Payer: Self-pay | Admitting: *Deleted

## 2013-03-29 LAB — REMOTE PACEMAKER DEVICE
BAMS-0001: 171 {beats}/min
RV LEAD AMPLITUDE: 10.2 mv

## 2013-04-18 ENCOUNTER — Other Ambulatory Visit: Payer: Self-pay | Admitting: Dermatology

## 2013-08-22 ENCOUNTER — Ambulatory Visit
Admission: RE | Admit: 2013-08-22 | Discharge: 2013-08-22 | Disposition: A | Discharge status: Home / Self Care | Payer: PRIVATE HEALTH INSURANCE | Source: Ambulatory Visit | Attending: Family Medicine | Admitting: Family Medicine

## 2013-08-22 ENCOUNTER — Other Ambulatory Visit: Payer: Self-pay | Admitting: Family Medicine

## 2013-08-22 DIAGNOSIS — M542 Cervicalgia: Secondary | ICD-10-CM

## 2013-09-05 ENCOUNTER — Telehealth: Payer: Self-pay | Admitting: Cardiovascular Disease

## 2013-09-05 NOTE — Telephone Encounter (Signed)
Returned call and pt verified x 2.  Pt informed message received and advice given per Dr. Royann Shivers.  Pt informed RN also has this information in a letter that he can take to his PCP or therapist.  Pt verbalized understanding and agreed w/ plan.  Pt will pick up letter today.

## 2013-09-05 NOTE — Telephone Encounter (Signed)
Please call-his primary doctor wants him to have physical therapy on his neck.He wants to know if this will be all right?

## 2013-09-05 NOTE — Telephone Encounter (Signed)
Returned call.  Left message to call back before 4pm.  Message forwarded to Dr. Royann Shivers.

## 2013-09-05 NOTE — Telephone Encounter (Signed)
I think so, but no one should apply pressure in the area of his carotid sinus (anterior neck, under angle of jaw. He should be sitting down/lying down whenever his neck is manipulated. The pacemaker should offer protection from full syncope.

## 2013-09-09 ENCOUNTER — Ambulatory Visit: Payer: PRIVATE HEALTH INSURANCE | Attending: Family Medicine | Admitting: Physical Therapy

## 2013-09-09 DIAGNOSIS — IMO0001 Reserved for inherently not codable concepts without codable children: Secondary | ICD-10-CM | POA: Insufficient documentation

## 2013-09-09 DIAGNOSIS — M542 Cervicalgia: Secondary | ICD-10-CM | POA: Insufficient documentation

## 2013-09-17 ENCOUNTER — Ambulatory Visit: Payer: PRIVATE HEALTH INSURANCE | Admitting: Physical Therapy

## 2013-09-24 ENCOUNTER — Ambulatory Visit: Payer: PRIVATE HEALTH INSURANCE | Admitting: Physical Therapy

## 2013-10-02 ENCOUNTER — Ambulatory Visit: Payer: PRIVATE HEALTH INSURANCE | Admitting: Physical Therapy

## 2013-10-07 ENCOUNTER — Ambulatory Visit: Payer: PRIVATE HEALTH INSURANCE | Attending: Family Medicine | Admitting: Physical Therapy

## 2013-10-07 DIAGNOSIS — M542 Cervicalgia: Secondary | ICD-10-CM | POA: Insufficient documentation

## 2013-10-07 DIAGNOSIS — IMO0001 Reserved for inherently not codable concepts without codable children: Secondary | ICD-10-CM | POA: Insufficient documentation

## 2013-10-09 ENCOUNTER — Encounter: Payer: PRIVATE HEALTH INSURANCE | Admitting: Physical Therapy

## 2013-10-15 ENCOUNTER — Ambulatory Visit: Payer: PRIVATE HEALTH INSURANCE | Admitting: Physical Therapy

## 2013-12-03 ENCOUNTER — Ambulatory Visit (INDEPENDENT_AMBULATORY_CARE_PROVIDER_SITE_OTHER): Payer: PRIVATE HEALTH INSURANCE | Admitting: Cardiovascular Disease

## 2013-12-03 ENCOUNTER — Encounter: Payer: Self-pay | Admitting: Cardiovascular Disease

## 2013-12-03 VITALS — BP 132/92 | HR 77 | Resp 16 | Ht 70.0 in | Wt 221.2 lb

## 2013-12-03 DIAGNOSIS — Z950 Presence of cardiac pacemaker: Secondary | ICD-10-CM

## 2013-12-03 DIAGNOSIS — I471 Supraventricular tachycardia: Secondary | ICD-10-CM

## 2013-12-03 DIAGNOSIS — I498 Other specified cardiac arrhythmias: Secondary | ICD-10-CM

## 2013-12-03 DIAGNOSIS — E118 Type 2 diabetes mellitus with unspecified complications: Secondary | ICD-10-CM

## 2013-12-03 DIAGNOSIS — I4729 Other ventricular tachycardia: Secondary | ICD-10-CM

## 2013-12-03 DIAGNOSIS — I10 Essential (primary) hypertension: Secondary | ICD-10-CM

## 2013-12-03 DIAGNOSIS — E782 Mixed hyperlipidemia: Secondary | ICD-10-CM

## 2013-12-03 DIAGNOSIS — I472 Ventricular tachycardia: Secondary | ICD-10-CM

## 2013-12-03 DIAGNOSIS — I2510 Atherosclerotic heart disease of native coronary artery without angina pectoris: Secondary | ICD-10-CM

## 2013-12-03 DIAGNOSIS — G9001 Carotid sinus syncope: Secondary | ICD-10-CM

## 2013-12-03 LAB — PACEMAKER DEVICE OBSERVATION

## 2013-12-03 NOTE — Patient Instructions (Signed)
Remote monitoring is used to monitor your Pacemaker of ICD from home. This monitoring reduces the number of office visits required to check your device to one time per year. It allows [place] to keep an eye on the functioning of your device to ensure it is working properly. You are scheduled for a device check from home on June 6th, 2015. You may send your transmission at any time that day. If you have a wireless device, the transmission will be sent automatically. After your physician reviews your transmission, you will receive a postcard with your next transmission date.  Your physician recommends that you schedule a follow-up appointment in: 12 months

## 2013-12-09 LAB — MDC_IDC_ENUM_SESS_TYPE_INCLINIC
Brady Statistic RA Percent Paced: 6 %
Lead Channel Impedance Value: 448 Ohm
Lead Channel Impedance Value: 640 Ohm
Lead Channel Sensing Intrinsic Amplitude: 3.8 mV
Lead Channel Setting Pacing Amplitude: 2 V
Lead Channel Setting Pacing Amplitude: 2 V
Lead Channel Setting Pacing Pulse Width: 0.4 ms
MDC IDC MSMT BATTERY VOLTAGE: 3 V
MDC IDC MSMT LEADCHNL RV SENSING INTR AMPL: 10.2 mV
MDC IDC SET LEADCHNL RV SENSING SENSITIVITY: 0.9 mV
MDC IDC SET ZONE DETECTION INTERVAL: 350 ms
MDC IDC SET ZONE DETECTION INTERVAL: 400 ms
MDC IDC STAT BRADY RV PERCENT PACED: 0.1 %

## 2013-12-14 ENCOUNTER — Encounter: Payer: Self-pay | Admitting: Cardiovascular Disease

## 2013-12-14 DIAGNOSIS — I472 Ventricular tachycardia: Secondary | ICD-10-CM | POA: Insufficient documentation

## 2013-12-14 DIAGNOSIS — Z950 Presence of cardiac pacemaker: Secondary | ICD-10-CM | POA: Insufficient documentation

## 2013-12-14 DIAGNOSIS — I4729 Other ventricular tachycardia: Secondary | ICD-10-CM | POA: Insufficient documentation

## 2013-12-14 DIAGNOSIS — G9001 Carotid sinus syncope: Secondary | ICD-10-CM | POA: Insufficient documentation

## 2013-12-14 DIAGNOSIS — E118 Type 2 diabetes mellitus with unspecified complications: Secondary | ICD-10-CM | POA: Insufficient documentation

## 2013-12-14 DIAGNOSIS — E782 Mixed hyperlipidemia: Secondary | ICD-10-CM | POA: Insufficient documentation

## 2013-12-14 DIAGNOSIS — I2510 Atherosclerotic heart disease of native coronary artery without angina pectoris: Secondary | ICD-10-CM | POA: Insufficient documentation

## 2013-12-14 DIAGNOSIS — I471 Supraventricular tachycardia: Secondary | ICD-10-CM | POA: Insufficient documentation

## 2013-12-14 DIAGNOSIS — I4719 Other supraventricular tachycardia: Secondary | ICD-10-CM | POA: Insufficient documentation

## 2013-12-14 DIAGNOSIS — I10 Essential (primary) hypertension: Secondary | ICD-10-CM | POA: Insufficient documentation

## 2013-12-14 NOTE — Assessment & Plan Note (Signed)
He does not have angina pectoris. He has multiple coronary risk factors. All of them appear to be linked to obesity and metabolic syndrome

## 2013-12-14 NOTE — Assessment & Plan Note (Signed)
Borderline elevation in diastolic blood pressure today. The patient reports that this is not typical for him. No adjustments were made to his medications.

## 2013-12-14 NOTE — Progress Notes (Signed)
Patient ID: Nathan Harding, male   DOB: 1951-09-03, 63 y.o.   MRN: [id number]      Reason for office visit Pacemaker followup, CAD and coronary risk factor management.  Nathan Harding has done well since his last appointment. He has not had any syncopal events. Glycemic control is a little better, but still not optimal. He has not had chest pain or shortness of breath or dizziness. His pacemaker interrogation shows roughly 6% atrial pacing, some of which is possibly pacing to compensate for carotid sinus hypersensitivity induced bradycardia. His device was recorded rare and brief episodes of paroxysmal atrial tachycardia and a single 7 beat run of nonsustained VT.   No Known Allergies  Current Outpatient Prescriptions  Medication Sig Dispense Refill  . carvedilol (COREG) 6.25 MG tablet Take 6.25 mg by mouth 2 (two) times daily with a meal.        . esomeprazole (NEXIUM) 40 MG capsule Take 40 mg by mouth daily before breakfast.      . fenofibrate (TRICOR) 145 MG tablet Take 145 mg by mouth daily.        . fexofenadine (ALLEGRA) 180 MG tablet Take 180 mg by mouth daily.        . fluticasone (FLONASE) 50 MCG/ACT nasal spray Place 2 sprays into the nose daily.        Marland Kitchen glimepiride (AMARYL) 1 MG tablet Take 2 mg by mouth daily before breakfast.      . lisinopril (PRINIVIL,ZESTRIL) 20 MG tablet Take 20 mg by mouth daily.      . metFORMIN (GLUCOPHAGE) 1000 MG tablet Take 1,000 mg by mouth daily with breakfast.       . omega-3 acid ethyl esters (LOVAZA) 1 G capsule Take 2 g by mouth 2 (two) times daily.        Marland Kitchen PARoxetine (PAXIL) 30 MG tablet Take 30 mg by mouth every morning.        . rosuvastatin (CRESTOR) 20 MG tablet Take 20 mg by mouth daily.        . [DISCONTINUED] ramipril (ALTACE) 5 MG capsule Take 5 mg by mouth 2 (two) times daily.         No current facility-administered medications for this visit.    Past Medical History  Diagnosis Date  . Allergy   . Anxiety   . Arthritis   . Cancer       skin- Basal Cell CA  . Diabetes mellitus   . GERD (gastroesophageal reflux disease)   . Hyperlipidemia   . Hypertension   . Ulcer   . Sleep apnea     uses CPAP  . Severe sinus bradycardia   . Carotid sinus syndrome with bradycardia     Past Surgical History  Procedure Laterality Date  . Pacemaker insertion  03/31/11    due to hypersensitive carotid sinus syndrome  . Colonoscopy    . Temporal mastoidectomy  2009    behind left ear  . Rotator cuff repair  2010    right  . Shoulder surgery  2002    right    Family History  Problem Relation Age of Onset  . Colon cancer Neg Hx   . Stomach cancer Neg Hx   . Esophageal cancer Cousin   . Cancer Mother     leukemia  . Diabetes Father   . Hypertension Brother   . Hypertension Brother     History   Social History  . Marital Status: Married    Spouse  Name: N/A    Number of Children: N/A  . Years of Education: N/A   Occupational History  . Not on file.   Social History Main Topics  . Smoking status: Former Research scientist (life sciences)  . Smokeless tobacco: Not on file  . Alcohol Use: 3.6 oz/week    6 Cans of beer per week     Comment: no drank in one month  -  updated 12/12/11  . Drug Use: No  . Sexual Activity:    Other Topics Concern  . Not on file   Social History Narrative  . No narrative on file    Review of systems: The patient specifically denies any chest pain at rest or with exertion, dyspnea at rest or with exertion, orthopnea, paroxysmal nocturnal dyspnea, syncope, palpitations, focal neurological deficits, intermittent claudication, lower extremity edema, unexplained weight gain, cough, hemoptysis or wheezing.  The patient also denies abdominal pain, nausea, vomiting, dysphagia, diarrhea, constipation, polyuria, polydipsia, dysuria, hematuria, frequency, urgency, abnormal bleeding or bruising, fever, chills, unexpected weight changes, mood swings, change in skin or hair texture, change in voice quality, auditory or  visual problems, allergic reactions or rashes, new musculoskeletal complaints other than usual "aches and pains".   PHYSICAL EXAM BP 132/92  Pulse 77  Resp 16  Ht 5\' 10"  (1.778 m)  Wt 100.336 kg (221 lb 3.2 oz)  BMI 31.74 kg/m2  General: Alert, oriented x3, no distress Head: no evidence of trauma, PERRL, EOMI, no exophtalmos or lid lag, no myxedema, no xanthelasma; normal ears, nose and oropharynx Neck: normal jugular venous pulsations and no hepatojugular reflux; brisk carotid pulses without delay and no carotid bruits Chest: clear to auscultation, no signs of consolidation by percussion or palpation, normal fremitus, symmetrical and full respiratory excursions, normal appearance of the left subclavian pacemaker site Cardiovascular: normal position and quality of the apical impulse, regular rhythm, normal first and second heart sounds, no murmurs, rubs or gallops Abdomen: no tenderness or distention, no masses by palpation, no abnormal pulsatility or arterial bruits, normal bowel sounds, no hepatosplenomegaly Extremities: no clubbing, cyanosis or edema; 2+ radial, ulnar and brachial pulses bilaterally; 2+ right femoral, posterior tibial and dorsalis pedis pulses; 2+ left femoral, posterior tibial and dorsalis pedis pulses; no subclavian or femoral bruits Neurological: grossly nonfocal   EKG: Sinus rhythm , poor R-wave progression  Lipid Panel     Component Value Date/Time   CHOL  Value: 212        ATP III CLASSIFICATION:  <200     mg/dL   Desirable  200-239  mg/dL   Borderline High  >=240    mg/dL   High       * 08/22/2009 0525   TRIG 383* 08/22/2009 0525   HDL 40 08/22/2009 0525   CHOLHDL 5.3 08/22/2009 0525   VLDL 77* 08/22/2009 0525   LDLCALC  Value: 95        Total Cholesterol/HDL:CHD Risk Coronary Heart Disease Risk Table                     Men   Women  1/2 Average Risk   3.4   3.3  Average Risk       5.0   4.4  2 X Average Risk   9.6   7.1  3 X Average Risk  23.4   11.0         Use the calculated Patient Ratio above and the CHD Risk Table to determine the patient's CHD Risk.  ATP III CLASSIFICATION (LDL):  <100     mg/dL   Optimal  100-129  mg/dL   Near or Above                    Optimal  130-159  mg/dL   Borderline  160-189  mg/dL   High  >190     mg/dL   Very High 08/22/2009 0525    BMET    Component Value Date/Time   NA 135 12/13/2011 1923   K 4.1 12/13/2011 1923   CL 102 12/13/2011 1923   CO2 20 12/13/2011 1923   GLUCOSE 139* 12/13/2011 1923   BUN 19 12/13/2011 1923   CREATININE 1.14 12/13/2011 1923   CALCIUM 9.3 12/13/2011 [place] 68* 12/13/2011 1923   GFRAA 79* 12/13/2011 1923     ASSESSMENT AND PLAN Carotid sinus hypersensitivity with syncope Since his pacemaker was implanted he has not had full-blown syncope. He had one near syncopal event that was consistent with orthostatic hypotension.  Pacemaker - dual chamber Medtronic revo, MRI conditional 2012 Normal device function. He has had MRI studies without complications. Only 6% atrial pacing and virtually no ventricular pacing. No permanent reprogramming changes made today. Continue CareLink monitor  CAD (coronary artery disease) - 40% LAD 2010 He does not have angina pectoris. He has multiple coronary risk factors. All of them appear to be linked to obesity and metabolic syndrome  DM type 2 causing complication I do not have a recent hemoglobin A1c to review but the patient believes it was 7.1% recently.  Mixed hyperlipidemia April 2014 total cholesterol 141, triglycerides 238, HDL 44, LDL (direct measurement) 73. He seems to be an appropriate dose of statin. He started taking a maximum dose of chemotherapy fibroid. Further improvement in triglyceride levels will require weight loss and better glycemic control.  PAT (paroxysmal atrial tachycardia) His device is periodically recorded brief episodes of SVT, some of them may represent sinus tachycardia (may be an aborted presyncopal vagal  event), but at least one of them is clearly paroxysmal atrial tachycardia lasting for about a minute and a half 154 beats per minute. This was asymptomatic and specific therapy does not appear to be necessary. He is already on beta blockers.  Nonsustained ventricular tachycardia A single 7 beat run of nonsustained VT at 200 beats per minute was recorded by his pacemaker and was asymptomatic. Continue beta blockers  HTN (hypertension) Borderline elevation in diastolic blood pressure today. The patient reports that this is not typical for him. No adjustments were made to his medications.  Orders Placed This Encounter  Procedures  . Implantable device check  . EKG 12-Lead    Patient Instructions  Remote monitoring is used to monitor your Pacemaker of ICD from home. This monitoring reduces the number of office visits required to check your device to one time per year. It allows [place] to keep an eye on the functioning of your device to ensure it is working properly. You are scheduled for a device check from home on June 6th, 2015. You may send your transmission at any time that day. If you have a wireless device, the transmission will be sent automatically. After your physician reviews your transmission, you will receive a postcard with your next transmission date.  Your physician recommends that you schedule a follow-up appointment in: 12 months     Mekenna Finau  Sanda Klein, MD, [facility] [phone] office [phone] pager

## 2013-12-14 NOTE — Assessment & Plan Note (Signed)
Since his pacemaker was implanted he has not had full-blown syncope. He had one near syncopal event that was consistent with orthostatic hypotension.

## 2013-12-14 NOTE — Assessment & Plan Note (Signed)
I do not have a recent hemoglobin A1c to review but the patient believes it was 7.1% recently.

## 2013-12-14 NOTE — Assessment & Plan Note (Signed)
His device is periodically recorded brief episodes of SVT, some of them may represent sinus tachycardia (may be an aborted presyncopal vagal event), but at least one of them is clearly paroxysmal atrial tachycardia lasting for about a minute and a half 154 beats per minute. This was asymptomatic and specific therapy does not appear to be necessary. He is already on beta blockers.

## 2013-12-14 NOTE — Assessment & Plan Note (Signed)
A single 7 beat run of nonsustained VT at 200 beats per minute was recorded by his pacemaker and was asymptomatic. Continue beta blockers

## 2013-12-14 NOTE — Assessment & Plan Note (Signed)
April 2014 total cholesterol 141, triglycerides 238, HDL 44, LDL (direct measurement) 73. He seems to be an appropriate dose of statin. He started taking a maximum dose of chemotherapy fibroid. Further improvement in triglyceride levels will require weight loss and better glycemic control.

## 2013-12-14 NOTE — Assessment & Plan Note (Signed)
Normal device function. He has had MRI studies without complications. Only 6% atrial pacing and virtually no ventricular pacing. No permanent reprogramming changes made today. Continue CareLink monitor

## 2013-12-17 ENCOUNTER — Encounter: Payer: Self-pay | Admitting: Cardiovascular Disease

## 2014-02-05 ENCOUNTER — Other Ambulatory Visit (HOSPITAL_COMMUNITY): Payer: Self-pay | Admitting: Orthopedic Surgery

## 2014-02-05 DIAGNOSIS — M25562 Pain in left knee: Secondary | ICD-10-CM

## 2014-02-18 ENCOUNTER — Encounter (HOSPITAL_COMMUNITY): Payer: Self-pay

## 2014-02-20 ENCOUNTER — Ambulatory Visit (HOSPITAL_COMMUNITY)
Admission: RE | Admit: 2014-02-20 | Discharge: 2014-02-20 | Disposition: A | Discharge status: Home / Self Care | Payer: PRIVATE HEALTH INSURANCE | Source: Ambulatory Visit | Attending: Orthopedic Surgery | Admitting: Orthopedic Surgery

## 2014-02-20 DIAGNOSIS — M25562 Pain in left knee: Secondary | ICD-10-CM

## 2014-02-20 DIAGNOSIS — M25579 Pain in unspecified ankle and joints of unspecified foot: Secondary | ICD-10-CM | POA: Insufficient documentation

## 2014-03-06 ENCOUNTER — Encounter: Payer: Self-pay | Admitting: Cardiovascular Disease

## 2014-03-06 DIAGNOSIS — I4729 Other ventricular tachycardia: Secondary | ICD-10-CM

## 2014-03-06 DIAGNOSIS — I472 Ventricular tachycardia: Secondary | ICD-10-CM

## 2014-03-07 ENCOUNTER — Other Ambulatory Visit: Payer: Self-pay | Admitting: Cardiovascular Disease

## 2014-03-10 ENCOUNTER — Ambulatory Visit (INDEPENDENT_AMBULATORY_CARE_PROVIDER_SITE_OTHER): Payer: PRIVATE HEALTH INSURANCE | Admitting: *Deleted

## 2014-03-10 DIAGNOSIS — I472 Ventricular tachycardia: Secondary | ICD-10-CM

## 2014-03-10 DIAGNOSIS — I4729 Other ventricular tachycardia: Secondary | ICD-10-CM

## 2014-03-10 LAB — MDC_IDC_ENUM_SESS_TYPE_REMOTE
Battery Voltage: 2.99 V
Brady Statistic RA Percent Paced: 5.7 %
Brady Statistic RV Percent Paced: 0 %
Date Time Interrogation Session: 20150604230010
Lead Channel Impedance Value: 456 Ohm
Lead Channel Sensing Intrinsic Amplitude: 10.2336
Lead Channel Setting Pacing Amplitude: 2 V
Lead Channel Setting Sensing Sensitivity: 0.9 mV
MDC IDC MSMT LEADCHNL RA IMPEDANCE VALUE: 608 Ohm
MDC IDC MSMT LEADCHNL RA SENSING INTR AMPL: 3.5783
MDC IDC SET LEADCHNL RA PACING AMPLITUDE: 2 V
MDC IDC SET LEADCHNL RV PACING PULSEWIDTH: 0.4 ms
MDC IDC STAT BRADY AP VP PERCENT: 0 %
MDC IDC STAT BRADY AP VS PERCENT: 5.7 %
MDC IDC STAT BRADY AS VP PERCENT: 0 %
MDC IDC STAT BRADY AS VS PERCENT: 94.3 %
Zone Setting Detection Interval: 350 ms
Zone Setting Detection Interval: 400 ms

## 2014-03-10 NOTE — Progress Notes (Signed)
Remote pacemaker transmission.   

## 2014-04-01 ENCOUNTER — Encounter: Payer: Self-pay | Admitting: Cardiology

## 2014-04-08 ENCOUNTER — Encounter: Payer: Self-pay | Admitting: Cardiovascular Disease

## 2014-04-17 ENCOUNTER — Other Ambulatory Visit: Payer: Self-pay | Admitting: Dermatology

## 2014-06-11 ENCOUNTER — Ambulatory Visit (INDEPENDENT_AMBULATORY_CARE_PROVIDER_SITE_OTHER): Payer: PRIVATE HEALTH INSURANCE | Admitting: *Deleted

## 2014-06-11 ENCOUNTER — Encounter: Payer: Self-pay | Admitting: Cardiovascular Disease

## 2014-06-11 DIAGNOSIS — I498 Other specified cardiac arrhythmias: Secondary | ICD-10-CM

## 2014-06-11 LAB — MDC_IDC_ENUM_SESS_TYPE_REMOTE
Battery Voltage: 2.99 V
Brady Statistic AP VS Percent: 5.5 %
Brady Statistic AS VP Percent: 0 %
Brady Statistic RA Percent Paced: 5.5 %
Brady Statistic RV Percent Paced: 0 %
Date Time Interrogation Session: 20150909130921
Lead Channel Impedance Value: 448 Ohm
Lead Channel Sensing Intrinsic Amplitude: 9.8925
Lead Channel Setting Pacing Amplitude: 2 V
Lead Channel Setting Sensing Sensitivity: 0.9 mV
MDC IDC MSMT LEADCHNL RA IMPEDANCE VALUE: 544 Ohm
MDC IDC MSMT LEADCHNL RA SENSING INTR AMPL: 3.2249
MDC IDC SET LEADCHNL RA PACING AMPLITUDE: 2 V
MDC IDC SET LEADCHNL RV PACING PULSEWIDTH: 0.4 ms
MDC IDC STAT BRADY AP VP PERCENT: 0 %
MDC IDC STAT BRADY AS VS PERCENT: 94.5 %
Zone Setting Detection Interval: 350 ms
Zone Setting Detection Interval: 400 ms

## 2014-06-11 NOTE — Progress Notes (Signed)
Remote pacemaker transmission.   

## 2014-06-24 ENCOUNTER — Encounter: Payer: Self-pay | Admitting: Cardiology

## 2014-07-08 ENCOUNTER — Encounter: Payer: Self-pay | Admitting: Internal Medicine

## 2014-07-15 ENCOUNTER — Encounter: Payer: Self-pay | Admitting: Internal Medicine

## 2014-07-17 ENCOUNTER — Encounter: Payer: Self-pay | Admitting: Gastroenterology

## 2014-08-19 ENCOUNTER — Telehealth: Payer: Self-pay | Admitting: *Deleted

## 2014-08-19 NOTE — Telephone Encounter (Signed)
John or [employer],  Could you please review this pt's chart?  He has a pacemaker, with some noted PAT and Vtach with his cardiology visit in March, 2015.  He is already on beta blockers, so no further treatment needed.  I just wanted to make you aware of this.    Thanks, [employer]

## 2014-08-19 NOTE — Telephone Encounter (Signed)
Kristen,  This pt is cleared for care at [facility]  Thanks,  Areej Tayler 

## 2014-08-21 ENCOUNTER — Ambulatory Visit (AMBULATORY_SURGERY_CENTER): Payer: Self-pay | Admitting: *Deleted

## 2014-08-21 VITALS — Ht 70.0 in | Wt 228.2 lb

## 2014-08-21 DIAGNOSIS — Z86010 Personal history of colonic polyps: Secondary | ICD-10-CM

## 2014-08-21 MED ORDER — MOVIPREP 100 G PO SOLR: 1.0000 | Freq: Once | ORAL | Status: DC

## 2014-08-21 NOTE — Progress Notes (Signed)
No egg or soy allergy. ewm No issues with sedation. ewm Pt has a pacemaker, no defib. ewm No diet pills. ewm No blood thinners. ewm Pt declined emmi video. ewm Hx PAT, 7 beats v tach per remote pacemaker read.  Pt states he is feeling fine, and cardio aware with no further procedures necessary. ewm

## 2014-09-04 ENCOUNTER — Ambulatory Visit (AMBULATORY_SURGERY_CENTER): Payer: PRIVATE HEALTH INSURANCE | Admitting: Internal Medicine

## 2014-09-04 ENCOUNTER — Encounter: Payer: Self-pay | Admitting: Internal Medicine

## 2014-09-04 VITALS — BP 126/88 | HR 61 | Temp 96.7°F | Resp 11 | Ht 70.0 in | Wt 228.0 lb

## 2014-09-04 DIAGNOSIS — D122 Benign neoplasm of ascending colon: Secondary | ICD-10-CM

## 2014-09-04 DIAGNOSIS — D120 Benign neoplasm of cecum: Secondary | ICD-10-CM

## 2014-09-04 DIAGNOSIS — D125 Benign neoplasm of sigmoid colon: Secondary | ICD-10-CM

## 2014-09-04 DIAGNOSIS — D123 Benign neoplasm of transverse colon: Secondary | ICD-10-CM

## 2014-09-04 DIAGNOSIS — Z86010 Personal history of colonic polyps: Secondary | ICD-10-CM

## 2014-09-04 LAB — GLUCOSE, CAPILLARY
Glucose-Capillary: 153 mg/dL — ABNORMAL HIGH (ref 70–99)
Glucose-Capillary: 134 mg/dL — ABNORMAL HIGH (ref 70–99)

## 2014-09-04 NOTE — Progress Notes (Signed)
Called to room to assist during endoscopic procedure.  Patient ID and intended procedure confirmed with present staff. Received instructions for my participation in the procedure from the performing physician.  

## 2014-09-04 NOTE — Patient Instructions (Signed)
Colon polyps x 9 removed today, handout given on polyps. Result letter in your mail in about 1-2 weeks. Hold all NSAIDS, anti-inflammatory and aspirin products for 2 weeks. Call us with any questions or concerns. Thank you!  YOU HAD AN ENDOSCOPIC PROCEDURE TODAY AT [facility]: Refer to the procedure report that was given to you for any specific questions about what was found during the examination.  If the procedure report does not answer your questions, please call your gastroenterologist to clarify.  If you requested that your care partner not be given the details of your procedure findings, then the procedure report has been included in a sealed envelope for you to review at your convenience later.  YOU SHOULD EXPECT: Some feelings of bloating in the abdomen. Passage of more gas than usual.  Walking can help get rid of the air that was put into your GI tract during the procedure and reduce the bloating. If you had a lower endoscopy (such as a colonoscopy or flexible sigmoidoscopy) you may notice spotting of blood in your stool or on the toilet paper. If you underwent a bowel prep for your procedure, then you may not have a normal bowel movement for a few days.  DIET: Your first meal following the procedure should be a light meal and then it is ok to progress to your normal diet.  A half-sandwich or bowl of soup is an example of a good first meal.  Heavy or fried foods are harder to digest and may make you feel nauseous or bloated.  Likewise meals heavy in dairy and vegetables can cause extra gas to form and this can also increase the bloating.  Drink plenty of fluids but you should avoid alcoholic beverages for 24 hours.  ACTIVITY: Your care partner should take you home directly after the procedure.  You should plan to take it easy, moving slowly for the rest of the day.  You can resume normal activity the day after the procedure however you should NOT DRIVE or use heavy machinery for  24 hours (because of the sedation medicines used during the test).    SYMPTOMS TO REPORT IMMEDIATELY: A gastroenterologist can be reached at any hour.  During normal business hours, 8:30 AM to 5:00 PM Monday through Friday, call [phone].  After hours and on weekends, please call the GI answering service at [phone] who will take a message and have the physician on call contact you.   Following lower endoscopy (colonoscopy or flexible sigmoidoscopy):  Excessive amounts of blood in the stool  Significant tenderness or worsening of abdominal pains  Swelling of the abdomen that is new, acute  Fever of 100F or higher  Following upper endoscopy (EGD)  Vomiting of blood or coffee ground material  New chest pain or pain under the shoulder blades  Painful or persistently difficult swallowing  New shortness of breath  Fever of 100F or higher  Black, tarry-looking stools  FOLLOW UP: If any biopsies were taken you will be contacted by phone or by letter within the next 1-3 weeks.  Call your gastroenterologist if you have not heard about the biopsies in 3 weeks.  Our staff will call the home number listed on your records the next business day following your procedure to check on you and address any questions or concerns that you may have at that time regarding the information given to you following your procedure. This is a courtesy call and so if there is  no answer at the home number and we have not heard from you through the emergency physician on call, we will assume that you have returned to your regular daily activities without incident.  SIGNATURES/CONFIDENTIALITY: You and/or your care partner have signed paperwork which will be entered into your electronic medical record.  These signatures attest to the fact that that the information above on your After Visit Summary has been reviewed and is understood.  Full responsibility of the confidentiality of this discharge information lies  with you and/or your care-partner.

## 2014-09-04 NOTE — Op Note (Signed)
[place]  [address] [employer] [place], [id number]   COLONOSCOPY PROCEDURE REPORT  PATIENT: Nathan Harding, Nathan Harding  MR#: [id number] BIRTHDATE: 1951/05/14 , 63  yrs. old GENDER: male ENDOSCOPIST: Jerene Bears, MD REFERRED HY:IFOYD Patterson, M.D. PROCEDURE DATE:  09/04/2014 PROCEDURE:   Colonoscopy with snare polypectomy and Colonoscopy with cold biopsy polypectomy First Screening Colonoscopy - Avg.  risk and is 50 yrs.  old or older - No.  Prior Negative Screening - Now for repeat screening. N/A  History of Adenoma - Now for follow-up colonoscopy & has been > or = to 3 yrs.  Yes hx of adenoma.  Has been 3 or more years since last colonoscopy.  Polyps Removed Today? Yes. ASA CLASS:   Class II INDICATIONS:high risk personal history of colonic polyps and last colonoscopy completed  years ago. MEDICATIONS: Propofol 320 mg IV and Monitored anesthesia care  DESCRIPTION OF PROCEDURE:   After the risks benefits and alternatives of the procedure were thoroughly explained, informed consent was obtained.  The digital rectal exam revealed no rectal mass.   The LB [id number]  endoscope was introduced through the anus and advanced to the cecum, which was identified by both the appendix and ileocecal valve. No adverse events experienced. The quality of the prep was good, using MoviPrep  The instrument was then slowly withdrawn as the colon was fully examined.    COLON FINDINGS: Two sessile polyps ranging from 10 to 12mm in size with mucous caps were found in the transverse colon and ascending colon.  Polypectomies were performed using snare cautery.  The resection was complete, the polyp tissue was completely retrieved and sent to histology.   Seven sessile polyps ranging between 3-60mm in size were found in the sigmoid colon (2, cold forcep), transverse colon (4, snare), and at the cecum (1 snare). Polypectomies were performed with cold forceps and with a cold snare.  The  resection was complete, the polyp tissue was completely retrieved and sent to histology.  Retroflexed views revealed internal hemorrhoids. The time to cecum=2 minutes 41 seconds. Withdrawal time=25 minutes 58 seconds.  The scope was withdrawn and the procedure completed.  COMPLICATIONS: There were no immediate complications.  ENDOSCOPIC IMPRESSION: 1.   Two sessile polyps ranging from 10 to 6mm in size were found in the transverse colon and ascending colon; polypectomies were performed using snare cautery 2.   Seven sessile polyps ranging between 3-9mm in size were found in the sigmoid colon, transverse colon, and at the cecum; polypectomies were performed with cold forceps and with a cold snare  RECOMMENDATIONS: 1.  Avoid all NSAIDs for the next 2 weeks. 2.  Await pathology results 3.  Timing of repeat colonoscopy will be determined by pathology findings. 4.  You will receive a letter within 1-2 weeks with the results of your biopsy as well as final recommendations.  Please call my office if you have not received a letter after 3 weeks.  eSigned:  Jerene Bears, MD 09/04/2014 8:49 AM   cc: The Patient; Lujean Amel, MD   PATIENT NAME:  Nathan Harding, Nathan Harding MR#: [id number]

## 2014-09-04 NOTE — Progress Notes (Signed)
Procedure ends, to recovery, report given and VSS. 

## 2014-09-05 ENCOUNTER — Telehealth: Payer: Self-pay | Admitting: *Deleted

## 2014-09-05 NOTE — Telephone Encounter (Signed)
  Follow up Call-  Call back number 09/04/2014  Post procedure Call Back phone  # [phone]  Permission to leave phone message Yes     No answer.  Left message on voicemail.

## 2014-09-12 ENCOUNTER — Encounter: Payer: Self-pay | Admitting: Internal Medicine

## 2014-09-15 ENCOUNTER — Ambulatory Visit (INDEPENDENT_AMBULATORY_CARE_PROVIDER_SITE_OTHER): Payer: PRIVATE HEALTH INSURANCE | Admitting: *Deleted

## 2014-09-15 ENCOUNTER — Encounter: Payer: Self-pay | Admitting: Cardiovascular Disease

## 2014-09-15 DIAGNOSIS — I472 Ventricular tachycardia: Secondary | ICD-10-CM

## 2014-09-15 DIAGNOSIS — I4729 Other ventricular tachycardia: Secondary | ICD-10-CM

## 2014-09-15 LAB — MDC_IDC_ENUM_SESS_TYPE_REMOTE
Brady Statistic AP VS Percent: 5.15 %
Brady Statistic AS VP Percent: 0 %
Brady Statistic AS VS Percent: 94.84 %
Date Time Interrogation Session: 20151214132033
Lead Channel Impedance Value: 456 Ohm
Lead Channel Impedance Value: 568 Ohm
Lead Channel Sensing Intrinsic Amplitude: 9.2 mV
Lead Channel Setting Pacing Amplitude: 2 V
Lead Channel Setting Pacing Amplitude: 2 V
Lead Channel Setting Pacing Pulse Width: 0.4 ms
Lead Channel Setting Sensing Sensitivity: 0.9 mV
MDC IDC MSMT BATTERY VOLTAGE: 2.99 V
MDC IDC MSMT LEADCHNL RA SENSING INTR AMPL: 4 mV
MDC IDC SET ZONE DETECTION INTERVAL: 400 ms
MDC IDC STAT BRADY AP VP PERCENT: 0 %
MDC IDC STAT BRADY RA PERCENT PACED: 5.16 %
MDC IDC STAT BRADY RV PERCENT PACED: 0 %
Zone Setting Detection Interval: 350 ms

## 2014-09-15 NOTE — Progress Notes (Signed)
Remote pacemaker transmission.   

## 2014-09-16 ENCOUNTER — Encounter: Payer: Self-pay | Admitting: Cardiology

## 2014-09-18 ENCOUNTER — Other Ambulatory Visit: Payer: Self-pay | Admitting: Family Medicine

## 2014-09-18 ENCOUNTER — Ambulatory Visit
Admission: RE | Admit: 2014-09-18 | Discharge: 2014-09-18 | Disposition: A | Discharge status: Home / Self Care | Payer: PRIVATE HEALTH INSURANCE | Source: Ambulatory Visit | Attending: Family Medicine | Admitting: Family Medicine

## 2014-09-18 DIAGNOSIS — M545 Low back pain: Secondary | ICD-10-CM

## 2014-10-21 ENCOUNTER — Encounter: Payer: Self-pay | Admitting: Podiatry

## 2014-10-21 ENCOUNTER — Ambulatory Visit (INDEPENDENT_AMBULATORY_CARE_PROVIDER_SITE_OTHER): Payer: PRIVATE HEALTH INSURANCE | Admitting: Podiatry

## 2014-10-21 VITALS — BP 131/84 | HR 91 | Resp 13 | Ht 70.0 in | Wt 225.0 lb

## 2014-10-21 DIAGNOSIS — L600 Ingrowing nail: Secondary | ICD-10-CM

## 2014-10-21 MED ORDER — NEOMYCIN-POLYMYXIN-HC 3.5-10000-1 OT SOLN: OTIC | Status: DC

## 2014-10-21 NOTE — Progress Notes (Signed)
   Subjective:    Patient ID: Nathan Harding, male    DOB: June 14, 1951, 64 y.o.   MRN: [id number]  HPI Comments: Pt states he has a thickened and occasional painful left 1st toenail.     Review of Systems  HENT: Positive for congestion, hearing loss, sinus pressure and tinnitus.   Musculoskeletal: Positive for back pain and arthralgias.  Allergic/Immunologic: Positive for environmental allergies.       Hayfever  All other systems reviewed and are negative.      Objective:   Physical Exam: I have reviewed her past medical history medications allergies surgery social history and review of systems. His pulses are palpable bilateral. Neurologic sensorium is intact per Semmes-Weinstein monofilament. Deep tendon reflexes are intact bilateral muscle strength +5 over 5 dorsiflexion plantar flexors and inverters everters all his musculatures intact. Orthopedic evaluation to straight all joints distal to the ankle have a full range of motion without crepitation. Cutaneous evaluationdemonstrates supple well-hydrated uterus with exception of the nail plate hallux left which appears to be grossly dystrophic and painful on palpation with surrounding erythema.        Assessment & Plan:  Assessment: Nail dystrophy hallux left ingrown nail painful hallux left.  Plan: Total toenail avulsion with matrixectomy hallux left he tolerated procedure well after local anesthetic. He was given both oral and written home going instructions for care of this to follow up with him in 1 week.

## 2014-10-21 NOTE — Patient Instructions (Signed)

## 2014-10-28 ENCOUNTER — Encounter: Payer: Self-pay | Admitting: Podiatry

## 2014-10-28 ENCOUNTER — Ambulatory Visit (INDEPENDENT_AMBULATORY_CARE_PROVIDER_SITE_OTHER): Payer: PRIVATE HEALTH INSURANCE | Admitting: Podiatry

## 2014-10-28 DIAGNOSIS — L600 Ingrowing nail: Secondary | ICD-10-CM

## 2014-10-28 NOTE — Progress Notes (Signed)
He presents today for nail avulsion hallux left he denies fever chills nausea vomiting muscle aches and pains and states that he continues to soak on a regular basis.  Objective: Vital signs are stable he is alert and oriented 3. The total nail avulsion appears to be healing very nicely.  Assessment: Well-healing surgical to hallux left.  Plan: Continue soaking until granulation tissue has developed into skin. He will follow-up with me with questions or concerns regarding infection.

## 2014-12-03 ENCOUNTER — Encounter: Payer: Self-pay | Admitting: *Deleted

## 2015-02-24 ENCOUNTER — Ambulatory Visit (INDEPENDENT_AMBULATORY_CARE_PROVIDER_SITE_OTHER): Payer: PRIVATE HEALTH INSURANCE | Admitting: Cardiovascular Disease

## 2015-02-24 ENCOUNTER — Encounter: Payer: Self-pay | Admitting: Cardiovascular Disease

## 2015-02-24 VITALS — BP 128/82 | HR 99 | Resp 16 | Ht 70.0 in | Wt 228.0 lb

## 2015-02-24 DIAGNOSIS — I472 Ventricular tachycardia: Secondary | ICD-10-CM

## 2015-02-24 DIAGNOSIS — Z79899 Other long term (current) drug therapy: Secondary | ICD-10-CM | POA: Diagnosis not present

## 2015-02-24 DIAGNOSIS — E785 Hyperlipidemia, unspecified: Secondary | ICD-10-CM

## 2015-02-24 DIAGNOSIS — I4729 Other ventricular tachycardia: Secondary | ICD-10-CM

## 2015-02-24 DIAGNOSIS — I2510 Atherosclerotic heart disease of native coronary artery without angina pectoris: Secondary | ICD-10-CM

## 2015-02-24 DIAGNOSIS — G9001 Carotid sinus syncope: Secondary | ICD-10-CM

## 2015-02-24 DIAGNOSIS — I10 Essential (primary) hypertension: Secondary | ICD-10-CM

## 2015-02-24 DIAGNOSIS — I471 Supraventricular tachycardia: Secondary | ICD-10-CM | POA: Diagnosis not present

## 2015-02-24 LAB — CUP PACEART INCLINIC DEVICE CHECK
Brady Statistic AP VP Percent: 0 %
Brady Statistic AP VS Percent: 2.66 %
Brady Statistic AS VP Percent: 0 %
Brady Statistic AS VS Percent: 97.34 %
Date Time Interrogation Session: 20160524131717
Lead Channel Impedance Value: 448 Ohm
Lead Channel Impedance Value: 552 Ohm
Lead Channel Sensing Intrinsic Amplitude: 3.402 mV
Lead Channel Setting Pacing Amplitude: 2 V
Lead Channel Setting Pacing Amplitude: 2 V
MDC IDC MSMT BATTERY VOLTAGE: 2.99 V
MDC IDC MSMT LEADCHNL RV SENSING INTR AMPL: 9.551 mV
MDC IDC SET LEADCHNL RV PACING PULSEWIDTH: 0.4 ms
MDC IDC SET LEADCHNL RV SENSING SENSITIVITY: 0.9 mV
MDC IDC STAT BRADY RA PERCENT PACED: 2.66 %
MDC IDC STAT BRADY RV PERCENT PACED: 0 %
Zone Setting Detection Interval: 350 ms
Zone Setting Detection Interval: 400 ms

## 2015-02-24 NOTE — Progress Notes (Signed)
Patient ID: Nathan Harding, male   DOB: 1950/10/23, 64 y.o.   MRN: [id number]      Cardiology Office Note   Date:  02/24/2015   ID:  Nathan Harding, DOB [birth year], MRN [id number]  PCP:  Lujean Amel, MD  Cardiologist:   Sanda Klein, MD   Chief Complaint  Patient presents with  . Annual Exam    No complaints.  Recent stressful stretch with wife in an accident and lost his father early May.      History of Present Illness: Nathan Harding is a 64 y.o. male who presents for pacemaker follow-up. 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 He has not had any cardiac problems, but his wife was recently involved in a serious motor vehicle accident in his father recently passed away after of large stroke. He has been rather shaken by these events.  He has multiple coronary risk factors remains mildly obese. He has not had a lot of time to dedicated to physical health. On the bright side, his incontinence problem is markedly better following implantation of an artificial sphincter.  Interrogation of his pacemaker shows normal device function. Generator longevity 2.99 V (RRT 2.81 V) there is only 2.7% atrial pacing and no ventricular pacing. He has had numerous episodes of regular supraventricular tachycardia that probably represent sinus tachycardia but these are short-lived with an overall burden of less than 0.1%.    Past Medical History  Diagnosis Date  . Allergy   . Anxiety   . Arthritis   . Cancer     skin- Basal Cell CA  . Diabetes mellitus   . GERD (gastroesophageal reflux disease)   . Hyperlipidemia   . Hypertension   . Ulcer   . Sleep apnea     uses CPAP  . Severe sinus bradycardia   . Carotid sinus syndrome with bradycardia   . Prostate cancer 2013  . ED (erectile dysfunction)   . Male urinary stress incontinence   . Right renal mass   . Depression   . Cardiac pacemaker      Past Surgical History  Procedure Laterality Date  . Pacemaker insertion  03/31/11    due to hypersensitive carotid sinus syndrome  . Colonoscopy    . Temporal mastoidectomy  2009    behind left ear  . Rotator cuff repair  2010    right  . Shoulder surgery  2002    right  . Penile prosthesis implant    . Prostatectomy    . Polypectomy       Current Outpatient Prescriptions  Medication Sig Dispense Refill  . acetaminophen (TYLENOL) 500 MG tablet Take 500 mg by mouth every 6 (six) hours as needed.    . carvedilol (COREG) 6.25 MG tablet Take 6.25 mg by mouth 2 (two) times daily with a meal.      . esomeprazole (NEXIUM) 40 MG capsule Take 40 mg by mouth daily before breakfast.    . fenofibrate (TRICOR) 145 MG tablet Take 145 mg by mouth daily.      . fexofenadine (ALLEGRA) 180 MG tablet Take 180 mg by mouth daily.      . fluticasone (FLONASE) 50 MCG/ACT nasal spray Place 2 sprays into the nose daily.      Marland Kitchen glimepiride (AMARYL) 1 MG tablet Take 2 mg by mouth daily before breakfast.    . lisinopril (PRINIVIL,ZESTRIL) 20 MG tablet Take 20 mg  by mouth daily.    . metFORMIN (GLUCOPHAGE-XR) 500 MG 24 hr tablet Take 2,000 mg by mouth daily.  0  . neomycin-polymyxin-hydrocortisone (CORTISPORIN) otic solution 1-2 drops to the toe after soaking twice daily 10 mL 1  . omega-3 acid ethyl esters (LOVAZA) 1 G capsule Take 2 g by mouth 2 (two) times daily.      Marland Kitchen PARoxetine (PAXIL) 30 MG tablet Take 30 mg by mouth every morning.      . rosuvastatin (CRESTOR) 20 MG tablet Take 20 mg by mouth daily.      Marland Kitchen tetrahydrozoline-zinc (VISINE-AC) 0.05-0.25 % ophthalmic solution Place 2 drops into both eyes 3 (three) times daily as needed.    . [DISCONTINUED] ramipril (ALTACE) 5 MG capsule Take 5 mg by mouth 2 (two) times daily.       No current facility-administered medications for this visit.    Allergies:   Review of patient's allergies indicates no known allergies.    Social History:  The  patient  reports that he has quit smoking. He has never used smokeless tobacco. He reports that he drinks about 3.6 oz of alcohol per week. He reports that he does not use illicit drugs.   Family History:  The patient's family history includes Cancer in his mother; Diabetes in his father; Esophageal cancer in his cousin; Hypertension in his brother and brother. There is no history of Colon cancer, Stomach cancer, or Rectal cancer.    ROS:  Please see the history of present illness.    Otherwise, review of systems positive for none.   All other systems are reviewed and negative.    PHYSICAL EXAM: VS:  BP 128/82 mmHg  Pulse 99  Resp 16  Ht 5\' 10"  (1.778 m)  Wt 103.42 kg (228 lb)  BMI 32.71 kg/m2 , BMI Body mass index is 32.71 kg/(m^2).  General: Alert, oriented x3, no distress Head: no evidence of trauma, PERRL, EOMI, no exophtalmos or lid lag, no myxedema, no xanthelasma; normal ears, nose and oropharynx Neck: normal jugular venous pulsations and no hepatojugular reflux; brisk carotid pulses without delay and no carotid bruits Chest: clear to auscultation, no signs of consolidation by percussion or palpation, normal fremitus, symmetrical and full respiratory excursions Cardiovascular: normal position and quality of the apical impulse, regular rhythm, normal first and second heart sounds, no murmurs, rubs or gallops Abdomen: no tenderness or distention, no masses by palpation, no abnormal pulsatility or arterial bruits, normal bowel sounds, no hepatosplenomegaly Extremities: no clubbing, cyanosis or edema; 2+ radial, ulnar and brachial pulses bilaterally; 2+ right femoral, posterior tibial and dorsalis pedis pulses; 2+ left femoral, posterior tibial and dorsalis pedis pulses; no subclavian or femoral bruits Neurological: grossly nonfocal Psych: euthymic mood, full affect   EKG:  EKG is ordered today. The ekg ordered today demonstrates normal sinus rhythm, small Q waves in leads 3 and aVF  but not in lead 2, generalized low voltage   Recent Labs: No results found for requested labs within last 365 days.     Wt Readings from Last 3 Encounters:  02/24/15 103.42 kg (228 lb)  10/21/14 102.059 kg (225 lb)  09/04/14 103.42 kg (228 lb)      ASSESSMENT AND PLAN:  Carotid sinus hypersensitivity with syncope Since his pacemaker was implanted he has not had full-blown syncope. He had one near syncopal event that was consistent with orthostatic hypotension.  Pacemaker - dual chamber Medtronic revo, MRI conditional 2012 Normal device function. He has had MRI studies  without complications. Only 3% atrial pacing and virtually no ventricular pacing. No permanent reprogramming changes made today. Continue [facility] monitor  CAD (coronary artery disease) - 40% LAD 2010 He does not have angina pectoris. He has multiple coronary risk factors. All of them appear to be linked to obesity and metabolic syndrome  DM type 2 causing complication I do not have a recent hemoglobin A1c to review  Mixed hyperlipidemia April 2014 total cholesterol 141, triglycerides 238, HDL 44, LDL (direct measurement) 73. He seems to be an appropriate dose of statin. He started taking a maximum dose of fenofibrate. Further improvement in triglyceride levels will require weight loss and better glycemic control. Time to repeat labs  PAT (paroxysmal atrial tachycardia) His device is periodically recorded brief episodes of SVT, some of them may represent sinus tachycardia (may be an aborted presyncopal vagal event). Asymptomatic and specific therapy does not appear to be necessary. He is already on beta blockers.  Nonsustained ventricular tachycardia None recorded on his most recent pacemaker check  HTN (hypertension) Good control today   Current medicines are reviewed at length with the patient today.  The patient does not have concerns regarding medicines.  The following changes have been made:  no  change  Labs/ tests ordered today include:   Orders Placed This Encounter  Procedures  . CBC  . Comprehensive metabolic panel  . Lipid panel  . Implantable device check  . EKG 12-Lead   Patient Instructions  Your physician recommends that you return for lab work at your earliest [place]  Remote monitoring is used to monitor your Pacemaker of ICD from home. This monitoring reduces the number of office visits required to check your device to one time per year. It allows [place] to keep an eye on the functioning of your device to ensure it is working properly. You are scheduled for a device check from home on August 25,2016. You may send your transmission at any time that day. If you have a wireless device, the transmission will be sent automatically. After your physician reviews your transmission, you will receive a postcard with your next transmission date.  Dr Sallyanne Kuster recommends that you schedule a follow-up appointment in 12 months. You will receive a reminder letter in the mail two months in advance. If you don't receive a letter, please call our office to schedule the follow-up appointment.   Mikael Spray, MD  02/24/2015 6:36 PM    Sanda Klein, MD, [facility] [phone] office [phone] pager

## 2015-02-24 NOTE — Patient Instructions (Signed)
Your physician recommends that you return for lab work at your earliest [place]  Remote monitoring is used to monitor your Pacemaker of ICD from home. This monitoring reduces the number of office visits required to check your device to one time per year. It allows [place] to keep an eye on the functioning of your device to ensure it is working properly. You are scheduled for a device check from home on August 25,2016. You may send your transmission at any time that day. If you have a wireless device, the transmission will be sent automatically. After your physician reviews your transmission, you will receive a postcard with your next transmission date.  Dr Sallyanne Kuster recommends that you schedule a follow-up appointment in 12 months. You will receive a reminder letter in the mail two months in advance. If you don't receive a letter, please call our office to schedule the follow-up appointment.

## 2015-03-17 ENCOUNTER — Encounter: Payer: Self-pay | Admitting: Cardiovascular Disease

## 2015-03-18 LAB — COMPREHENSIVE METABOLIC PANEL
ALK PHOS: 51 U/L (ref 39–117)
ALT: 21 U/L (ref 0–53)
AST: 15 U/L (ref 0–37)
Albumin: 4.3 g/dL (ref 3.5–5.2)
BUN: 17 mg/dL (ref 6–23)
CO2: 25 mEq/L (ref 19–32)
CREATININE: 1.24 mg/dL (ref 0.50–1.35)
Calcium: 9.8 mg/dL (ref 8.4–10.5)
Chloride: 102 mEq/L (ref 96–112)
Glucose, Bld: 160 mg/dL — ABNORMAL HIGH (ref 70–99)
POTASSIUM: 5.7 meq/L — AB (ref 3.5–5.3)
Sodium: 136 mEq/L (ref 135–145)
Total Bilirubin: 0.4 mg/dL (ref 0.2–1.2)
Total Protein: 6.8 g/dL (ref 6.0–8.3)

## 2015-03-18 LAB — LIPID PANEL
CHOLESTEROL: 151 mg/dL (ref 0–200)
HDL: 48 mg/dL (ref >=40)
LDL CALC: 62 mg/dL (ref 0–99)
TRIGLYCERIDES: 203 mg/dL — AB (ref <150)
Total CHOL/HDL Ratio: 3.1 Ratio
VLDL: 41 mg/dL — ABNORMAL HIGH (ref 0–40)

## 2015-03-19 ENCOUNTER — Other Ambulatory Visit: Payer: Self-pay | Admitting: *Deleted

## 2015-03-19 DIAGNOSIS — E878 Other disorders of electrolyte and fluid balance, not elsewhere classified: Secondary | ICD-10-CM

## 2015-03-19 LAB — CBC
HCT: 44.5 % (ref 39.0–52.0)
Hemoglobin: 14.7 g/dL (ref 13.0–17.0)
MCH: 29.8 pg (ref 26.0–34.0)
MCHC: 33 g/dL (ref 30.0–36.0)
MCV: 90.1 fL (ref 78.0–100.0)
MPV: 10.2 fL (ref 8.6–12.4)
Platelets: 235 10*3/uL (ref 150–400)
RBC: 4.94 MIL/uL (ref 4.22–5.81)
RDW: 13.6 % (ref 11.5–15.5)
WBC: 6.7 10*3/uL (ref 4.0–10.5)

## 2015-05-26 ENCOUNTER — Telehealth: Payer: Self-pay | Admitting: Cardiology

## 2015-05-26 ENCOUNTER — Ambulatory Visit (INDEPENDENT_AMBULATORY_CARE_PROVIDER_SITE_OTHER): Payer: PRIVATE HEALTH INSURANCE | Admitting: *Deleted

## 2015-05-26 DIAGNOSIS — I471 Supraventricular tachycardia: Secondary | ICD-10-CM

## 2015-05-26 NOTE — Telephone Encounter (Signed)
Confirmed remote transmission w/ pt wife.   

## 2015-05-27 NOTE — Progress Notes (Signed)
Remote pacemaker transmission.   

## 2015-06-10 LAB — CUP PACEART REMOTE DEVICE CHECK
Brady Statistic AP VP Percent: 0 %
Brady Statistic AP VS Percent: 2.35 %
Brady Statistic AS VP Percent: 0 %
Brady Statistic AS VS Percent: 97.65 %
Date Time Interrogation Session: 20160824000952
Lead Channel Impedance Value: 576 Ohm
Lead Channel Sensing Intrinsic Amplitude: 3.755 mV
Lead Channel Sensing Intrinsic Amplitude: 9.21 mV
Lead Channel Setting Pacing Amplitude: 2 V
Lead Channel Setting Pacing Amplitude: 2 V
Lead Channel Setting Pacing Pulse Width: 0.4 ms
Lead Channel Setting Sensing Sensitivity: 0.9 mV
MDC IDC MSMT BATTERY VOLTAGE: 2.99 V
MDC IDC MSMT LEADCHNL RV IMPEDANCE VALUE: 440 Ohm
MDC IDC STAT BRADY RA PERCENT PACED: 2.35 %
MDC IDC STAT BRADY RV PERCENT PACED: 0 %
Zone Setting Detection Interval: 350 ms
Zone Setting Detection Interval: 400 ms

## 2015-06-16 ENCOUNTER — Other Ambulatory Visit: Payer: Self-pay | Admitting: Cardiovascular Disease

## 2015-06-16 MED ORDER — APIXABAN 5 MG PO TABS: 5.0000 mg | ORAL_TABLET | Freq: Two times a day (BID) | ORAL | Status: DC

## 2015-07-06 ENCOUNTER — Encounter: Payer: Self-pay | Admitting: Cardiovascular Disease

## 2015-08-11 ENCOUNTER — Encounter: Payer: Self-pay | Admitting: Cardiovascular Disease

## 2015-08-11 ENCOUNTER — Ambulatory Visit (INDEPENDENT_AMBULATORY_CARE_PROVIDER_SITE_OTHER): Payer: PRIVATE HEALTH INSURANCE | Admitting: Cardiovascular Disease

## 2015-08-11 ENCOUNTER — Encounter: Payer: Self-pay | Admitting: Internal Medicine

## 2015-08-11 VITALS — BP 128/86 | HR 92 | Ht 70.0 in | Wt 216.0 lb

## 2015-08-11 DIAGNOSIS — G9001 Carotid sinus syncope: Secondary | ICD-10-CM

## 2015-08-11 DIAGNOSIS — I2510 Atherosclerotic heart disease of native coronary artery without angina pectoris: Secondary | ICD-10-CM | POA: Diagnosis not present

## 2015-08-11 DIAGNOSIS — I10 Essential (primary) hypertension: Secondary | ICD-10-CM | POA: Diagnosis not present

## 2015-08-11 DIAGNOSIS — I471 Supraventricular tachycardia: Secondary | ICD-10-CM | POA: Diagnosis not present

## 2015-08-11 DIAGNOSIS — I472 Ventricular tachycardia: Secondary | ICD-10-CM

## 2015-08-11 DIAGNOSIS — E782 Mixed hyperlipidemia: Secondary | ICD-10-CM

## 2015-08-11 DIAGNOSIS — I4729 Other ventricular tachycardia: Secondary | ICD-10-CM

## 2015-08-11 DIAGNOSIS — Z950 Presence of cardiac pacemaker: Secondary | ICD-10-CM

## 2015-08-11 NOTE — Patient Instructions (Signed)
Remote monitoring is used to monitor your Pacemaker from home. This monitoring reduces the number of office visits required to check your device to one time per year. It allows [place] to monitor the functioning of your device to ensure it is working properly. You are scheduled for a device check from home on November 12, 2015. You may send your transmission at any time that day. If you have a wireless device, the transmission will be sent automatically. After your physician reviews your transmission, you will receive a postcard with your next transmission date.  Dr. Sallyanne Kuster recommends that you schedule a follow-up appointment in: [place] ([email or web address]).

## 2015-08-11 NOTE — Progress Notes (Signed)
Patient ID: ENIS RIECKE, male   DOB: Aug 02, 1951, 64 y.o.   MRN: [id number]      Cardiology Office Note   Date:  08/13/2015   ID:  JIA MOHAMED, DOB Aug 18, 1951, MRN [id number]  PCP:  Lujean Amel, MD  Cardiologist:   Sanda Klein, MD   Chief Complaint  Patient presents with  . FOLLOW UP TO DISCUSS ELIQUIS  . Shortness of Breath    AT TIMES      History of Present Illness: VERNAL HRITZ is a 64 y.o. male who presents for newly diagnosed atrial fibrillation, detected incidentally on a pacemaker check, asymptomatic. A single, but lengthy (56 minutes) episode was recorded in June. The ventricular rate was rapid (130s).  He believes the event may have been related to excessive caffeine, dehydration, working hard in the yard.  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 Device function is normal.  His embolic risk is intermediate. He is not yet 64 years old (but will be in a few months). He has not had CVA/TIA, HTN or CHF. He has a family history of stroke. He has mild vascular disease (nonobstructive CAD). He clearly has DM, albeit on low dose of oral meds.  CHADSVasc score is currently 1-2, will be 2-3 next year. I recommend oral anticoagulation, but he is reluctant due to the perceived risk of bleeding.  Past Medical History  Diagnosis Date  . Allergy   . Anxiety   . Arthritis   . Cancer (HCC)     skin- Basal Cell CA  . Diabetes mellitus   . GERD (gastroesophageal reflux disease)   . Hyperlipidemia   . Hypertension   . Ulcer   . Sleep apnea     uses CPAP  . Severe sinus bradycardia   . Carotid sinus syndrome with bradycardia   . Prostate cancer ([place]) 2013  . ED (erectile dysfunction)   . Male urinary stress incontinence   . Right renal mass   . Depression   . Cardiac pacemaker     Past Surgical History  Procedure Laterality Date  . Pacemaker insertion  03/31/11      due to hypersensitive carotid sinus syndrome  . Colonoscopy    . Temporal mastoidectomy  2009    behind left ear  . Rotator cuff repair  2010    right  . Shoulder surgery  2002    right  . Penile prosthesis implant    . Prostatectomy    . Polypectomy       Current Outpatient Prescriptions  Medication Sig Dispense Refill  . acetaminophen (TYLENOL) 500 MG tablet Take 500 mg by mouth every 6 (six) hours as needed.    . carvedilol (COREG) 6.25 MG tablet Take 6.25 mg by mouth 2 (two) times daily with a meal.      . esomeprazole (NEXIUM) 40 MG capsule Take 40 mg by mouth daily before breakfast.    . fenofibrate (TRICOR) 145 MG tablet Take 145 mg by mouth daily.      . fexofenadine (ALLEGRA) 180 MG tablet Take 180 mg by mouth daily.      . fluticasone (FLONASE) 50 MCG/ACT nasal spray Place 2 sprays into the nose daily.      Marland Kitchen glimepiride (AMARYL) 1 MG tablet Take 2 mg by mouth daily before breakfast.    . lisinopril (PRINIVIL,ZESTRIL) 20 MG tablet Take 20 mg by mouth daily.    Marland Kitchen  metFORMIN (GLUCOPHAGE-XR) 500 MG 24 hr tablet Take 2,000 mg by mouth daily.  0  . neomycin-polymyxin-hydrocortisone (CORTISPORIN) otic solution 1-2 drops to the toe after soaking twice daily 10 mL 1  . omega-3 acid ethyl esters (LOVAZA) 1 G capsule Take 2 g by mouth 2 (two) times daily.      Marland Kitchen PARoxetine (PAXIL) 30 MG tablet Take 30 mg by mouth every morning.      . rosuvastatin (CRESTOR) 20 MG tablet Take 20 mg by mouth daily.      Marland Kitchen tetrahydrozoline-zinc (VISINE-AC) 0.05-0.25 % ophthalmic solution Place 2 drops into both eyes 3 (three) times daily as needed.    Marland Kitchen apixaban (ELIQUIS) 5 MG TABS tablet Take 1 tablet (5 mg total) by mouth 2 (two) times daily. (Patient not taking: Reported on 08/11/2015) 60 tablet 11  . [DISCONTINUED] ramipril (ALTACE) 5 MG capsule Take 5 mg by mouth 2 (two) times daily.       No current facility-administered medications for this visit.    Allergies:   Review of patient's  allergies indicates no known allergies.    Social History:  The patient  reports that he has quit smoking. He has never used smokeless tobacco. He reports that he drinks about 3.6 oz of alcohol per week. He reports that he does not use illicit drugs.   Family History:  The patient's family history includes Cancer in his mother; Diabetes in his father; Esophageal cancer in his cousin; Hypertension in his brother and brother. There is no history of Colon cancer, Stomach cancer, or Rectal cancer.    ROS:  Please see the history of present illness.    Otherwise, review of systems positive for none.   All other systems are reviewed and negative.    PHYSICAL EXAM: VS:  BP 128/86 mmHg  Pulse 92  Ht 5\' 10"  (1.778 m)  Wt 216 lb (97.977 kg)  BMI 30.99 kg/m2 , BMI Body mass index is 30.99 kg/(m^2).  General: Alert, oriented x3, no distress Head: no evidence of trauma, PERRL, EOMI, no exophtalmos or lid lag, no myxedema, no xanthelasma; normal ears, nose and oropharynx Neck: normal jugular venous pulsations and no hepatojugular reflux; brisk carotid pulses without delay and no carotid bruits Chest: clear to auscultation, no signs of consolidation by percussion or palpation, normal fremitus, symmetrical and full respiratory excursions Cardiovascular: normal position and quality of the apical impulse, regular rhythm, normal first and second heart sounds, no murmurs, rubs or gallops Abdomen: no tenderness or distention, no masses by palpation, no abnormal pulsatility or arterial bruits, normal bowel sounds, no hepatosplenomegaly Extremities: no clubbing, cyanosis or edema; 2+ radial, ulnar and brachial pulses bilaterally; 2+ right femoral, posterior tibial and dorsalis pedis pulses; 2+ left femoral, posterior tibial and dorsalis pedis pulses; no subclavian or femoral bruits Neurological: grossly nonfocal Psych: euthymic mood, full affect   EKG:  EKG is not ordered today.  Recent Labs: 03/18/2015:  ALT 21; BUN 17; Creat 1.24; Hemoglobin 14.7; Platelets 235; Potassium 5.7*; Sodium 136    Lipid Panel    Component Value Date/Time   CHOL 151 03/18/2015 0939   TRIG 203* 03/18/2015 0939   HDL 48 03/18/2015 0939   CHOLHDL 3.1 03/18/2015 0939   VLDL 41* 03/18/2015 0939   LDLCALC 62 03/18/2015 0939      Wt Readings from Last 3 Encounters:  08/11/15 216 lb (97.977 kg)  02/24/15 228 lb (103.42 kg)  10/21/14 225 lb (102.059 kg)      ASSESSMENT AND  PLAN:  Paroxysmal atrial fibrillation with RVR I recommend oral anticoagulation, but he is reluctant.We reviewed the relative risk of bleeding and embolic stroke. The statistics favor use of a direct anticoagulant, but the overall risk is low (3-4% over the next year).  Will continue to use his pacemaker for monitoring. I think he agrees that another AFib episode would tip towards starting DOAC. Even more importantly, the briefest neurological event should prompt immediate evaluation and initiation of anticoagulation.  Carotid sinus hypersensitivity with syncope Since his pacemaker was implanted he has not had full-blown syncope. He had one near syncopal event that was consistent with orthostatic hypotension.  Pacemaker - dual chamber Medtronic revo, MRI conditional 2012 Normal device function. He has had MRI studies without complications. Only 3% atrial pacing and virtually no ventricular pacing. No permanent reprogramming changes made today. Continue CareLink monitor  CAD (coronary artery disease) - 40% LAD 2010 He does not have angina pectoris. He has multiple coronary risk factors. All of them appear to be linked to obesity and metabolic syndrome  DM type 2 causing complication  Mixed hyperlipidemia April 2014 total cholesterol 141, triglycerides 238, HDL 44, LDL (direct measurement) 73. He seems to be an appropriate dose of statin. He started taking a maximum dose of fenofibrate. Further improvement in triglyceride levels will require  weight loss and better glycemic control. Time to repeat labs  PAT (paroxysmal atrial tachycardia) His device is periodically recorded brief episodes of SVT, some of them may represent sinus tachycardia (may be an aborted presyncopal vagal event). Asymptomatic and specific therapy does not appear to be necessary. He is already on beta blockers.  Nonsustained ventricular tachycardia Occasionally recorded pacemaker check  HTN (hypertension) Good control   Current medicines are reviewed at length with the patient today.  The patient has concerns regarding medicines.  The following changes have been made:  no change  Labs/ tests ordered today include:  No orders of the defined types were placed in this encounter.     Patient Instructions  Remote monitoring is used to monitor your Pacemaker from home. This monitoring reduces the number of office visits required to check your device to one time per year. It allows [place] to monitor the functioning of your device to ensure it is working properly. You are scheduled for a device check from home on November 12, 2015. You may send your transmission at any time that day. If you have a wireless device, the transmission will be sent automatically. After your physician reviews your transmission, you will receive a postcard with your next transmission date.  Dr. Sallyanne Kuster recommends that you schedule a follow-up appointment in: [place] ([facility]).      Mikael Spray, MD  08/13/2015 11:08 PM    Sanda Klein, MD, [facility] [phone] office [phone] pager

## 2015-08-17 LAB — CUP PACEART INCLINIC DEVICE CHECK
Battery Voltage: 2.99 V
Brady Statistic AP VP Percent: 0 %
Brady Statistic AP VS Percent: 4.86 %
Brady Statistic AS VP Percent: 0 %
Brady Statistic AS VS Percent: 95.14 %
Brady Statistic RA Percent Paced: 4.86 %
Brady Statistic RV Percent Paced: 0 %
Date Time Interrogation Session: 20161108151843
Implantable Lead Implant Date: 20120628
Implantable Lead Implant Date: 20120628
Implantable Lead Location: 753859
Implantable Lead Location: 753860
Implantable Lead Model: 5086
Implantable Lead Model: 5086
Lead Channel Impedance Value: 440 Ohm
Lead Channel Impedance Value: 576 Ohm
Lead Channel Sensing Intrinsic Amplitude: 4.373 mV
Lead Channel Sensing Intrinsic Amplitude: 8.869 mV
Lead Channel Setting Pacing Amplitude: 2 V
Lead Channel Setting Pacing Amplitude: 2 V
Lead Channel Setting Pacing Pulse Width: 0.4 ms
Lead Channel Setting Sensing Sensitivity: 0.9 mV

## 2015-09-01 ENCOUNTER — Encounter: Payer: PRIVATE HEALTH INSURANCE | Admitting: Cardiovascular Disease

## 2015-11-11 ENCOUNTER — Ambulatory Visit (INDEPENDENT_AMBULATORY_CARE_PROVIDER_SITE_OTHER): Payer: PRIVATE HEALTH INSURANCE | Admitting: *Deleted

## 2015-11-11 ENCOUNTER — Telehealth: Payer: Self-pay | Admitting: Cardiology

## 2015-11-11 DIAGNOSIS — I471 Supraventricular tachycardia: Secondary | ICD-10-CM

## 2015-11-11 NOTE — Telephone Encounter (Signed)
LMOVM reminding pt to send remote transmission.   

## 2015-11-12 NOTE — Progress Notes (Signed)
Remote pacemaker transmission.   

## 2015-12-13 LAB — CUP PACEART REMOTE DEVICE CHECK
Battery Voltage: 2.99 V
Brady Statistic AS VS Percent: 93.63 %
Brady Statistic RA Percent Paced: 6.37 %
Date Time Interrogation Session: 20170209024218
Implantable Lead Implant Date: 20120628
Implantable Lead Location: 753859
Implantable Lead Model: 5086
Lead Channel Impedance Value: 440 Ohm
Lead Channel Sensing Intrinsic Amplitude: 8.869 mV
Lead Channel Setting Sensing Sensitivity: 0.9 mV
MDC IDC LEAD IMPLANT DT: 20120628
MDC IDC LEAD LOCATION: 753860
MDC IDC LEAD MODEL: 5086
MDC IDC MSMT LEADCHNL RA IMPEDANCE VALUE: 600 Ohm
MDC IDC MSMT LEADCHNL RA SENSING INTR AMPL: 3.181 mV
MDC IDC SET LEADCHNL RA PACING AMPLITUDE: 2 V
MDC IDC SET LEADCHNL RV PACING AMPLITUDE: 2 V
MDC IDC SET LEADCHNL RV PACING PULSEWIDTH: 0.4 ms
MDC IDC STAT BRADY AP VP PERCENT: 0 %
MDC IDC STAT BRADY AP VS PERCENT: 6.37 %
MDC IDC STAT BRADY AS VP PERCENT: 0 %
MDC IDC STAT BRADY RV PERCENT PACED: 0 %

## 2015-12-16 ENCOUNTER — Encounter: Payer: Self-pay | Admitting: Cardiology

## 2016-01-25 ENCOUNTER — Other Ambulatory Visit: Payer: Self-pay | Admitting: *Deleted

## 2016-01-25 MED ORDER — OMEGA-3-ACID ETHYL ESTERS 1 G PO CAPS: 2.0000 g | ORAL_CAPSULE | Freq: Two times a day (BID) | ORAL | Status: DC

## 2016-01-25 NOTE — Telephone Encounter (Signed)
Rx(s) sent to pharmacy electronically.  

## 2016-02-08 DIAGNOSIS — I480 Paroxysmal atrial fibrillation: Secondary | ICD-10-CM | POA: Insufficient documentation

## 2016-02-08 NOTE — Progress Notes (Signed)
Patient ID: Nathan Harding, male   DOB: Dec 03, 1950, 65 y.o.   MRN: [id number]    Cardiology Office Note    Date:  02/09/2016   ID:  Nathan Harding, DOB 1951/04/16, MRN [id number]  PCP:  Lujean Amel, MD  Cardiologist:   Sanda Klein, MD   Chief Complaint  Patient presents with  . Follow-up    Pacer check, no chest pain    History of Present Illness:  Nathan Harding is a 65 y.o. 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 He has also had brief NSVT and PAT. He has HTN, mild DM and minor CAD (40% LAD by cath 2010). CHADSVasc score is currently 2-3, will be 3-4 after his birthday next month. He has not had CVA/TIA or CHF. He has a family history of stroke.   Last year, after his device showed atrial fibrillation, I recommended anticoagulation, but he was reluctant. He has changed his mind and plans to start Eliquis now. He is trying to exercise more, working in yard. Pacemaker check confirms activity 4.6h/day  He has paid less attention to his diet, his weight has increased and his glycemic control has deteriorated.  Current pacemaker check shows no new episodes of atrial fibrillation. Generator voltage is 2.97V, ERI 2.81V. There is 6.7% atrial pacing and no ventricular pacing. A few episodes of SVT look like sinus tachycardia.  Past Medical History  Diagnosis Date  . Allergy   . Anxiety   . Arthritis   . Cancer (HCC)     skin- Basal Cell CA  . Diabetes mellitus   . GERD (gastroesophageal reflux disease)   . Hyperlipidemia   . Hypertension   . Ulcer   . Sleep apnea     uses CPAP  . Severe sinus bradycardia   . Carotid sinus syndrome with bradycardia   . Prostate cancer ([place]) 2013  . ED (erectile dysfunction)   . Male urinary stress incontinence   . Right renal mass   . Depression   . Cardiac  pacemaker     Past Surgical History  Procedure Laterality Date  . Pacemaker insertion  03/31/11    due to hypersensitive carotid sinus syndrome  . Colonoscopy    . Temporal mastoidectomy  2009    behind left ear  . Rotator cuff repair  2010    right  . Shoulder surgery  2002    right  . Penile prosthesis implant    . Prostatectomy    . Polypectomy      Current Medications: Outpatient Prescriptions Prior to Visit  Medication Sig Dispense Refill  . acetaminophen (TYLENOL) 500 MG tablet Take 500 mg by mouth every 6 (six) hours as needed.    Marland Kitchen apixaban (ELIQUIS) 5 MG TABS tablet Take 1 tablet (5 mg total) by mouth 2 (two) times daily. 60 tablet 11  . carvedilol (COREG) 6.25 MG tablet Take 6.25 mg by mouth 2 (two) times daily with a meal.      . esomeprazole (NEXIUM) 40 MG capsule Take 40 mg by mouth daily before breakfast.    . fenofibrate (TRICOR) 145 MG tablet Take 145 mg by mouth daily.      . fexofenadine (ALLEGRA) 180 MG tablet Take 180 mg by mouth daily.      . fluticasone (FLONASE) 50 MCG/ACT nasal spray Place 2 sprays into the nose daily.      Marland Kitchen  glimepiride (AMARYL) 1 MG tablet Take 2 mg by mouth daily before breakfast.    . lisinopril (PRINIVIL,ZESTRIL) 20 MG tablet Take 20 mg by mouth daily.    . metFORMIN (GLUCOPHAGE-XR) 500 MG 24 hr tablet Take 2,000 mg by mouth daily.  0  . neomycin-polymyxin-hydrocortisone (CORTISPORIN) otic solution 1-2 drops to the toe after soaking twice daily 10 mL 1  . omega-3 acid ethyl esters (LOVAZA) 1 g capsule Take 2 capsules (2 g total) by mouth 2 (two) times daily. 120 capsule 1  . PARoxetine (PAXIL) 30 MG tablet Take 30 mg by mouth every morning.      . rosuvastatin (CRESTOR) 20 MG tablet Take 20 mg by mouth daily.      Marland Kitchen tetrahydrozoline-zinc (VISINE-AC) 0.05-0.25 % ophthalmic solution Place 2 drops into both eyes 3 (three) times daily as needed.     No facility-administered medications prior to visit.     Allergies:   Review of  patient's allergies indicates no known allergies.   Social History   Social History  . Marital Status: Married    Spouse Name: N/A  . Number of Children: N/A  . Years of Education: N/A   Social History Main Topics  . Smoking status: Former Research scientist (life sciences)  . Smokeless tobacco: Never Used  . Alcohol Use: 3.6 oz/week    6 Cans of beer per week     Comment: no drank in one month  -  updated 12/12/11   update 1-6 pack every 2 weeks  . Drug Use: No  . Sexual Activity: Not Asked   Other Topics Concern  . None   Social History Narrative     Family History:  The patient's family history includes Cancer in his mother; Diabetes in his father; Esophageal cancer in his cousin; Hypertension in his brother and brother. There is no history of Colon cancer, Stomach cancer, or Rectal cancer.   ROS:   Please see the history of present illness.    ROS All other systems reviewed and are negative.   PHYSICAL EXAM:   VS:  BP 122/80 mmHg  Pulse 72  Ht 5\' 10"  (1.778 m)  Wt 99.961 kg (220 lb 6 oz)  BMI 31.62 kg/m2   GEN: Well nourished, well developed, in no acute distress HEENT: normal Neck: no JVD, carotid bruits, or masses Cardiac: RRR; no murmurs, rubs, or gallops,no edema  Respiratory:  clear to auscultation bilaterally, normal work of breathing GI: soft, nontender, nondistended, + BS MS: no deformity or atrophy Skin: warm and dry, no rash Neuro:  Alert and Oriented x 3, Strength and sensation are intact Psych: euthymic mood, full affect  Wt Readings from Last 3 Encounters:  02/09/16 99.961 kg (220 lb 6 oz)  08/11/15 97.977 kg (216 lb)  02/24/15 103.42 kg (228 lb)      Studies/Labs Reviewed:   EKG:  EKG is ordered today.  The ekg ordered today demonstrates NSR, QTC 404 ms.  Recent Labs: 03/18/2015: ALT 21; BUN 17; Creat 1.24; Hemoglobin 14.7; Platelets 235; Potassium 5.7*; Sodium 136   Lipid Panel    Component Value Date/Time   CHOL 151 03/18/2015 0939   TRIG 203* 03/18/2015 0939     HDL 48 03/18/2015 0939   CHOLHDL 3.1 03/18/2015 0939   VLDL 41* 03/18/2015 0939   LDLCALC 62 03/18/2015 0939      ASSESSMENT:    1. Paroxysmal atrial fibrillation (HCC)   2. Carotid sinus hypersensitivity with syncope   3. Pacemaker - dual chamber  Medtronic revo, MRI conditional 2012   4. Nonsustained ventricular tachycardia ([place])   5. PAT (paroxysmal atrial tachycardia) ([place])   6. Essential hypertension   7. Coronary artery disease involving native coronary artery of native heart without angina pectoris   8. Type 2 diabetes mellitus with complication, without long-term current use of insulin ([place])   9. Mixed hyperlipidemia   10. Therapeutic drug monitoring      PLAN:  In order of problems listed above:  1. Afib: CHADSVasc 3-4 after he turns 65 next month. Will start Eliquis. 2. Carotid sinus hypersensitivity: Since his pacemaker was implanted he has not had full-blown syncope. 3. PPM: Normal device function. He has had MRI studies without complications. Requires rare atrial pacing and virtually no ventricular pacing. No permanent reprogramming changes made today. Continue CareLink monitor Q3 months  4. NSVT: Occasionally recorded on pacemaker check, asymptomatic 5. PAT: His device periodically records brief episodes of SVT, some of them may represent sinus tachycardia (maybe an aborted presyncopal vagal event). He is already on beta blockers. 6. HTN: well controlled 7. CAD: minor LAD stenosis at remote cath 2010.He has multiple coronary risk factors. All of them appear to be linked to obesity and metabolic syndrome 8. DM: not well controlled; last A1c 7.4%. Weight gain likely contributory. Discussed carb restricted diet, need to avoid high glycemic index carbs (he likes pretzels as snacks). 9. HLP: recheck today    Medication Adjustments/Labs and Tests Ordered: Current medicines are reviewed at length with the patient today.  Concerns regarding medicines are outlined above.   Medication changes, Labs and Tests ordered today are listed in the Patient Instructions below. Patient Instructions  Medication Instructions: Dr Sallyanne Kuster recommends that you continue on your current medications as directed. Please refer to the Current Medication list given to you today.  Labwork: Your physician recommends that you return for lab work at your earliest - FASTING.   Testing/Procedures: 1. Remote Pacemaker Download - Remote monitoring is used to monitor your Pacemaker of ICD from home. This monitoring reduces the number of office visits required to check your device to one time per year. It allows [place] to keep an eye on the functioning of your device to ensure it is working properly. You are scheduled for a device check from home on Thursday, August 10th, 2017. You may send your transmission at any time that day. If you have a wireless device, the transmission will be sent automatically. After your physician reviews your transmission, you will receive a postcard with your next transmission date.  Follow-up: Dr Sallyanne Kuster recommends that you schedule a follow-up appointment in 6 months with a pacemaker check. You will receive a reminder letter in the mail two months in advance. If you don't receive a letter, please call our office to schedule the follow-up appointment.  If you need a refill on your cardiac medications before your next appointment, please call your pharmacy.     Mikael Spray, MD  02/09/2016 4:03 PM    [facility] [address], [place], [place]  [address] Phone: [phone]; Fax: [phone]

## 2016-02-09 ENCOUNTER — Encounter: Payer: Self-pay | Admitting: Cardiovascular Disease

## 2016-02-09 ENCOUNTER — Ambulatory Visit (INDEPENDENT_AMBULATORY_CARE_PROVIDER_SITE_OTHER): Payer: PRIVATE HEALTH INSURANCE | Admitting: Cardiovascular Disease

## 2016-02-09 VITALS — BP 122/80 | HR 72 | Ht 70.0 in | Wt 220.4 lb

## 2016-02-09 DIAGNOSIS — G9001 Carotid sinus syncope: Secondary | ICD-10-CM

## 2016-02-09 DIAGNOSIS — E118 Type 2 diabetes mellitus with unspecified complications: Secondary | ICD-10-CM

## 2016-02-09 DIAGNOSIS — I480 Paroxysmal atrial fibrillation: Secondary | ICD-10-CM | POA: Diagnosis not present

## 2016-02-09 DIAGNOSIS — I472 Ventricular tachycardia: Secondary | ICD-10-CM

## 2016-02-09 DIAGNOSIS — I2510 Atherosclerotic heart disease of native coronary artery without angina pectoris: Secondary | ICD-10-CM

## 2016-02-09 DIAGNOSIS — I471 Supraventricular tachycardia: Secondary | ICD-10-CM

## 2016-02-09 DIAGNOSIS — I4729 Other ventricular tachycardia: Secondary | ICD-10-CM

## 2016-02-09 DIAGNOSIS — Z5181 Encounter for therapeutic drug level monitoring: Secondary | ICD-10-CM

## 2016-02-09 DIAGNOSIS — Z950 Presence of cardiac pacemaker: Secondary | ICD-10-CM

## 2016-02-09 DIAGNOSIS — E782 Mixed hyperlipidemia: Secondary | ICD-10-CM

## 2016-02-09 DIAGNOSIS — I10 Essential (primary) hypertension: Secondary | ICD-10-CM

## 2016-02-09 NOTE — Patient Instructions (Signed)
Medication Instructions: Dr Sallyanne Kuster recommends that you continue on your current medications as directed. Please refer to the Current Medication list given to you today.  Labwork: Your physician recommends that you return for lab work at your earliest - FASTING.   Testing/Procedures: 1. Remote Pacemaker Download - Remote monitoring is used to monitor your Pacemaker of ICD from home. This monitoring reduces the number of office visits required to check your device to one time per year. It allows [place] to keep an eye on the functioning of your device to ensure it is working properly. You are scheduled for a device check from home on Thursday, August 10th, 2017. You may send your transmission at any time that day. If you have a wireless device, the transmission will be sent automatically. After your physician reviews your transmission, you will receive a postcard with your next transmission date.  Follow-up: Dr Sallyanne Kuster recommends that you schedule a follow-up appointment in 6 months with a pacemaker check. You will receive a reminder letter in the mail two months in advance. If you don't receive a letter, please call our office to schedule the follow-up appointment.  If you need a refill on your cardiac medications before your next appointment, please call your pharmacy.

## 2016-02-12 ENCOUNTER — Other Ambulatory Visit: Payer: Self-pay | Admitting: *Deleted

## 2016-02-12 MED ORDER — ROSUVASTATIN CALCIUM 20 MG PO TABS: 20.0000 mg | ORAL_TABLET | Freq: Every day | ORAL | Status: DC

## 2016-02-12 MED ORDER — FENOFIBRATE 145 MG PO TABS: 145.0000 mg | ORAL_TABLET | Freq: Every day | ORAL | Status: DC

## 2016-02-12 MED ORDER — OMEGA-3-ACID ETHYL ESTERS 1 G PO CAPS: 2.0000 g | ORAL_CAPSULE | Freq: Two times a day (BID) | ORAL | Status: DC

## 2016-02-16 LAB — HEMOGLOBIN A1C
HEMOGLOBIN A1C: 7.4 % — AB (ref <5.7)
MEAN PLASMA GLUCOSE: 166 mg/dL

## 2016-02-17 LAB — COMPREHENSIVE METABOLIC PANEL
ALBUMIN: 4.5 g/dL (ref 3.6–5.1)
ALK PHOS: 42 U/L (ref 40–115)
ALT: 17 U/L (ref 9–46)
AST: 15 U/L (ref 10–35)
BILIRUBIN TOTAL: 0.3 mg/dL (ref 0.2–1.2)
BUN: 18 mg/dL (ref 7–25)
CHLORIDE: 105 mmol/L (ref 98–110)
CO2: 24 mmol/L (ref 20–31)
CREATININE: 1.06 mg/dL (ref 0.70–1.25)
Calcium: 9.2 mg/dL (ref 8.6–10.3)
Glucose, Bld: 144 mg/dL — ABNORMAL HIGH (ref 65–99)
Potassium: 5.8 mmol/L — ABNORMAL HIGH (ref 3.5–5.3)
SODIUM: 138 mmol/L (ref 135–146)
TOTAL PROTEIN: 6.9 g/dL (ref 6.1–8.1)

## 2016-02-17 LAB — LIPID PANEL
CHOL/HDL RATIO: 2.7 ratio (ref <=5.0)
Cholesterol: 158 mg/dL (ref 125–200)
HDL: 58 mg/dL (ref >=40)
LDL CALC: 67 mg/dL (ref <130)
Triglycerides: 163 mg/dL — ABNORMAL HIGH (ref <150)
VLDL: 33 mg/dL — ABNORMAL HIGH (ref <30)

## 2016-02-19 ENCOUNTER — Telehealth: Payer: Self-pay | Admitting: Cardiovascular Disease

## 2016-02-19 NOTE — Telephone Encounter (Signed)
Returning your call from yesterday. °

## 2016-02-19 NOTE — Telephone Encounter (Signed)
Pt returning call to get his recent lab results. Results given to pt. He verbalized understanding.  Notes Recorded by Dionne Bucy Truitt, CMA on 02/18/2016 at 2:17 PM [email or web address] Notes Recorded by Sanda Klein, MD on 02/17/2016 at 4:08 PM Cholesterol is better than last year - LDL the same, but lower TG and better HDL (usually a sign of more physical activity). Not quite where we want the numbers yet, but better. Keep trying to lose weight and exercise more.

## 2016-02-20 LAB — CUP PACEART INCLINIC DEVICE CHECK
Battery Voltage: 2.97 V
Brady Statistic AP VP Percent: 0 %
Brady Statistic AP VS Percent: 6.65 %
Brady Statistic RA Percent Paced: 6.65 %
Brady Statistic RV Percent Paced: 0 %
Date Time Interrogation Session: 20170509135837
Implantable Lead Implant Date: 20120628
Implantable Lead Implant Date: 20120628
Implantable Lead Location: 753859
Implantable Lead Location: 753860
Implantable Lead Model: 5086
Lead Channel Impedance Value: 552 Ohm
Lead Channel Sensing Intrinsic Amplitude: 9.551 mV
Lead Channel Setting Pacing Amplitude: 2 V
Lead Channel Setting Pacing Pulse Width: 0.4 ms
Lead Channel Setting Sensing Sensitivity: 0.9 mV
MDC IDC LEAD MODEL: 5086
MDC IDC MSMT LEADCHNL RA SENSING INTR AMPL: 2.871 mV
MDC IDC MSMT LEADCHNL RV IMPEDANCE VALUE: 448 Ohm
MDC IDC SET LEADCHNL RV PACING AMPLITUDE: 2 V
MDC IDC STAT BRADY AS VP PERCENT: 0 %
MDC IDC STAT BRADY AS VS PERCENT: 93.35 %

## 2016-02-24 ENCOUNTER — Encounter: Payer: Self-pay | Admitting: Cardiovascular Disease

## 2016-05-12 ENCOUNTER — Telehealth: Payer: Self-pay | Admitting: Cardiology

## 2016-05-12 ENCOUNTER — Encounter: Payer: PRIVATE HEALTH INSURANCE | Admitting: *Deleted

## 2016-05-12 NOTE — Telephone Encounter (Signed)
LMOVM reminding pt to send remote transmission.   

## 2016-10-22 ENCOUNTER — Other Ambulatory Visit: Payer: Self-pay | Admitting: Cardiovascular Disease

## 2016-10-24 ENCOUNTER — Other Ambulatory Visit: Payer: Self-pay | Admitting: Cardiovascular Disease

## 2016-10-24 NOTE — Telephone Encounter (Signed)
New Message    *STAT* If patient is at the pharmacy, call can be transferred to refill team.   1. Which medications need to be refilled? (please list name of each medication and dose if known)  omega-3 acid ethyl esters (LOVAZA) 1 g capsule Take 2 capsules (2 g total) by mouth 2 (two) times daily. PLEASE CONTACT OFFICE FOR ADDITIONAL REFILLS     2. Which pharmacy/location (including street and city if local pharmacy) is medication to be sent to?[facility] in [place]  3. Do they need a 30 day or 90 day supply? 30   Pt has appointment on 11/08/16 9:45am

## 2016-10-24 NOTE — Telephone Encounter (Signed)
Rx(s) sent to pharmacy electronically.  

## 2016-10-25 MED ORDER — OMEGA-3-ACID ETHYL ESTERS 1 G PO CAPS: 2.0000 | ORAL_CAPSULE | Freq: Two times a day (BID) | ORAL | 0 refills | Status: DC

## 2016-11-02 ENCOUNTER — Ambulatory Visit (INDEPENDENT_AMBULATORY_CARE_PROVIDER_SITE_OTHER): Payer: Medicare Other | Admitting: Cardiovascular Disease

## 2016-11-02 VITALS — BP 115/80 | HR 70 | Ht 70.0 in | Wt 219.0 lb

## 2016-11-02 DIAGNOSIS — E782 Mixed hyperlipidemia: Secondary | ICD-10-CM

## 2016-11-02 DIAGNOSIS — G9001 Carotid sinus syncope: Secondary | ICD-10-CM | POA: Diagnosis not present

## 2016-11-02 DIAGNOSIS — Z950 Presence of cardiac pacemaker: Secondary | ICD-10-CM | POA: Diagnosis not present

## 2016-11-02 DIAGNOSIS — I10 Essential (primary) hypertension: Secondary | ICD-10-CM

## 2016-11-02 DIAGNOSIS — E118 Type 2 diabetes mellitus with unspecified complications: Secondary | ICD-10-CM

## 2016-11-02 DIAGNOSIS — I480 Paroxysmal atrial fibrillation: Secondary | ICD-10-CM | POA: Diagnosis not present

## 2016-11-02 DIAGNOSIS — I471 Supraventricular tachycardia: Secondary | ICD-10-CM

## 2016-11-02 DIAGNOSIS — I2510 Atherosclerotic heart disease of native coronary artery without angina pectoris: Secondary | ICD-10-CM | POA: Diagnosis not present

## 2016-11-02 DIAGNOSIS — I472 Ventricular tachycardia: Secondary | ICD-10-CM

## 2016-11-02 DIAGNOSIS — I4729 Other ventricular tachycardia: Secondary | ICD-10-CM

## 2016-11-02 NOTE — Progress Notes (Signed)
Patient ID: Nathan Harding, male   DOB: 07-24-1951, 66 y.o.   MRN: [id number]    Cardiology Office Note    Date:  11/03/2016   ID:  Nathan Harding, DOB March 06, 1951, MRN [id number]  PCP:  Lujean Amel, MD  Cardiologist:   Sanda Klein, MD   Chief Complaint  Patient presents with  . Follow-up    6 months, pt denied chest pain and SOB, pt denied swelling in legs ankles and feet.    History of Present Illness:  Nathan Harding is a 66 y.o. 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 He has also had brief NSVT and PAT. He has HTN, mild DM and minor CAD (40% LAD by cath 2010). CHADSVasc score is currently 3-4 after he turned 65 last year. He has not had CVA/TIA or CHF. He has a family history of stroke.   Pacemaker interrogation today shows normal function. He only has 8% atrial pacing and no ventricular pacing. Battery voltage is 2.97 V (ERI at 2.81 V). Lead parameters are excellent.  Last year, after his device showed atrial fibrillation, I recommended anticoagulation, but he was reluctant. He He has yet to start Eliquis, since he is planning to undergo some knee injections. He is trying to exercise more, working On a new home they have bought at the [place] in [place]. Pacemaker check confirms activity 4.6h/day  Current pacemaker check shows no new episodes of atrial fibrillation. Generator voltage is 2.97V, ERI 2.81V. There is 6.7% atrial pacing and no ventricular pacing. A few episodes of SVT look like sinus tachycardia.  Past Medical History:  Diagnosis Date  . Allergy   . Anxiety   . Arthritis   . Cancer (HCC)    skin- Basal Cell CA  . Cardiac pacemaker   . Carotid sinus syndrome with bradycardia   . Depression   . Diabetes mellitus   . ED (erectile dysfunction)   . GERD  (gastroesophageal reflux disease)   . Hyperlipidemia   . Hypertension   . Male urinary stress incontinence   . Prostate cancer ([place]) 2013  . Right renal mass   . Severe sinus bradycardia   . Sleep apnea    uses CPAP  . Ulcer [facility])     Past Surgical History:  Procedure Laterality Date  . COLONOSCOPY    . PACEMAKER INSERTION  03/31/11   due to hypersensitive carotid sinus syndrome  . PENILE PROSTHESIS IMPLANT    . POLYPECTOMY    . PROSTATECTOMY    . ROTATOR CUFF REPAIR  2010   right  . SHOULDER SURGERY  2002   right  . temporal mastoidectomy  2009   behind left ear    Current Medications: Outpatient Medications Prior to Visit  Medication Sig Dispense Refill  . acetaminophen (TYLENOL) 500 MG tablet Take 500 mg by mouth every 6 (six) hours as needed.    Marland Kitchen apixaban (ELIQUIS) 5 MG TABS tablet Take 1 tablet (5 mg total) by mouth 2 (two) times daily. 60 tablet 11  . carvedilol (COREG) 6.25 MG tablet Take 6.25 mg by mouth 2 (two) times daily with a meal.      . esomeprazole (NEXIUM) 40 MG capsule Take 40 mg by mouth daily before breakfast.    . fenofibrate (TRICOR) 145 MG tablet Take 1 tablet (145 mg  total) by mouth daily. 30 tablet 11  . fexofenadine (ALLEGRA) 180 MG tablet Take 180 mg by mouth daily.      . fluticasone (FLONASE) 50 MCG/ACT nasal spray Place 2 sprays into the nose daily.      Marland Kitchen glimepiride (AMARYL) 1 MG tablet Take 2 mg by mouth daily before breakfast.    . lisinopril (PRINIVIL,ZESTRIL) 20 MG tablet Take 20 mg by mouth daily.    . metFORMIN (GLUCOPHAGE-XR) 500 MG 24 hr tablet Take 2,000 mg by mouth daily.  0  . neomycin-polymyxin-hydrocortisone (CORTISPORIN) otic solution 1-2 drops to the toe after soaking twice daily 10 mL 1  . omega-3 acid ethyl esters (LOVAZA) 1 g capsule Take 2 capsules (2 g total) by mouth 2 (two) times daily. 120 capsule 0  . PARoxetine (PAXIL) 30 MG tablet Take 30 mg by mouth every morning.      . rosuvastatin (CRESTOR) 20 MG tablet Take 1  tablet (20 mg total) by mouth daily. 30 tablet 11  . tetrahydrozoline-zinc (VISINE-AC) 0.05-0.25 % ophthalmic solution Place 2 drops into both eyes 3 (three) times daily as needed.     No facility-administered medications prior to visit.      Allergies:   Patient has no known allergies.   Social History   Social History  . Marital status: Married    Spouse name: N/A  . Number of children: N/A  . Years of education: N/A   Social History Main Topics  . Smoking status: Former Research scientist (life sciences)  . Smokeless tobacco: Never Used  . Alcohol use 3.6 oz/week    6 Cans of beer per week     Comment: no drank in one month  -  updated 12/12/11   update 1-6 pack every 2 weeks  . Drug use: No  . Sexual activity: Not on file   Other Topics Concern  . Not on file   Social History Narrative  . No narrative on file     Family History:  The patient's family history includes Cancer in his mother; Diabetes in his father; Esophageal cancer in his cousin; Hypertension in his brother and brother.   ROS:   Please see the history of present illness.    ROS All other systems reviewed and are negative.   PHYSICAL EXAM:   VS:  BP 115/80   Pulse 70   Ht 5\' 10"  (1.778 m)   Wt 99.3 kg (219 lb)   BMI 31.42 kg/m    GEN: Well nourished, well developed, in no acute distress  HEENT: normal  Neck: no JVD, carotid bruits, or masses Cardiac: RRR; no murmurs, rubs, or gallops,no edema  Respiratory:  clear to auscultation bilaterally, normal work of breathing GI: soft, nontender, nondistended, + BS MS: no deformity or atrophy  Skin: warm and dry, no rash Neuro:  Alert and Oriented x 3, Strength and sensation are intact Psych: euthymic mood, full affect  Wt Readings from Last 3 Encounters:  11/02/16 99.3 kg (219 lb)  02/09/16 100 kg (220 lb 6 oz)  08/11/15 98 kg (216 lb)      Studies/Labs Reviewed:   EKG:  EKG is ordered today.  The ekg ordered today demonstrates NSR, There appears to be reversal of the V2  and V3 electrodes, otherwise normal tracing QTC 401 ms.  Recent Labs: 02/16/2016: ALT 17; BUN 18; Creat 1.06; Potassium 5.8; Sodium 138   Lipid Panel    Component Value Date/Time   CHOL 158 02/16/2016 1058   TRIG  163 (H) 02/16/2016 1058   HDL 58 02/16/2016 1058   CHOLHDL 2.7 02/16/2016 1058   VLDL 33 (H) 02/16/2016 1058   LDLCALC 67 02/16/2016 1058      ASSESSMENT:    1. Paroxysmal atrial fibrillation (HCC)   2. Carotid sinus hypersensitivity with syncope   3. Pacemaker   4. Nonsustained ventricular tachycardia ([place])   5. PAT (paroxysmal atrial tachycardia) ([place])   6. Essential hypertension   7. Coronary artery disease involving native coronary artery of native heart without angina pectoris   8. Type 2 diabetes mellitus with complication, without long-term current use of insulin ([place])   9. Mixed hyperlipidemia      PLAN:  In order of problems listed above:  1. Afib: The burden of arrhythmia is very low and no episodes have been recorded since  June 2016. CHADSVasc 3-4 after he turned 70 (age, HTN, DM, +/- minor CAD). The risk of stroke and the risk of bleeding are quite evenly matched in his case. He plans some invasive knee procedures. Will use his device to monitor for further arrhythmia. I have asked him to very promptly report any neurological symptoms, even if they are extremely brief. The shortest episode of TIA would heavily tilted the balance towards recommendation for full anticoagulant therapy. 2. Carotid sinus hypersensitivity: Since his pacemaker was implanted he has not had full-blown syncope. 3. PPM: Normal device function. He has had MRI studies without complications. Requires rare atrial pacing and virtually no ventricular pacing. No permanent reprogramming changes made today. Continue CareLink monitor Q3 months  4. NSVT: Occasionally recorded on pacemaker check, asymptomatic 5. PAT: His device periodically records brief episodes of SVT, some of them may  represent sinus tachycardia (maybe an aborted presyncopal vagal event). He is already on beta blockers. 6. HTN: well controlled 7. CAD: minor LAD stenosis at remote cath 2010.He has multiple coronary risk factors. All of them appear to be linked to obesity and metabolic syndrome. 8. DM: last A1c 7.4%. Again reviewed the importance of avoiding carbohydrate with high glycemic index and weight loss. Physically he is quite active. 9. HLP: Other than borderline elevated triglycerides his lipid profile is great. His triglycerides should easily normalize with better glycemic control and loss and weight   Medication Adjustments/Labs and Tests Ordered: Current medicines are reviewed at length with the patient today.  Concerns regarding medicines are outlined above.  Medication changes, Labs and Tests ordered today are listed in the Patient Instructions below. Patient Instructions  Dr Sallyanne Kuster recommends that you continue on your current medications as directed. Please refer to the Current Medication list given to you today.  Remote monitoring is used to monitor your Pacemaker of ICD from home. This monitoring reduces the number of office visits required to check your device to one time per year. It allows [place] to keep an eye on the functioning of your device to ensure it is working properly. You are scheduled for a device check from home on Wednesday, May 2nd, 2018. You may send your transmission at any time that day. If you have a wireless device, the transmission will be sent automatically. After your physician reviews your transmission, you will receive a postcard with your next transmission date.  Dr Sallyanne Kuster recommends that you schedule a follow-up appointment in 12 months with a pacemaker check. You will receive a reminder letter in the mail two months in advance. If you don't receive a letter, please call our office to schedule the follow-up appointment.  If  you need a refill on your cardiac medications  before your next appointment, please call your pharmacy.    Signed, Sanda Klein, MD  11/03/2016 4:18 PM    [facility] [address], [place], [place]  [address] Phone: [phone]; Fax: [phone]

## 2016-11-02 NOTE — Patient Instructions (Signed)
Dr Sallyanne Kuster recommends that you continue on your current medications as directed. Please refer to the Current Medication list given to you today.  Remote monitoring is used to monitor your Pacemaker of ICD from home. This monitoring reduces the number of office visits required to check your device to one time per year. It allows [place] to keep an eye on the functioning of your device to ensure it is working properly. You are scheduled for a device check from home on Wednesday, May 2nd, 2018. You may send your transmission at any time that day. If you have a wireless device, the transmission will be sent automatically. After your physician reviews your transmission, you will receive a postcard with your next transmission date.  Dr Sallyanne Kuster recommends that you schedule a follow-up appointment in 12 months with a pacemaker check. You will receive a reminder letter in the mail two months in advance. If you don't receive a letter, please call our office to schedule the follow-up appointment.  If you need a refill on your cardiac medications before your next appointment, please call your pharmacy.

## 2016-11-03 ENCOUNTER — Encounter: Payer: Self-pay | Admitting: Cardiovascular Disease

## 2016-11-08 ENCOUNTER — Encounter: Payer: PRIVATE HEALTH INSURANCE | Admitting: Cardiovascular Disease

## 2016-11-14 LAB — CUP PACEART INCLINIC DEVICE CHECK
Battery Voltage: 2.97 V
Brady Statistic AP VP Percent: 0 %
Brady Statistic AP VS Percent: 8.36 %
Brady Statistic AS VP Percent: 0 %
Brady Statistic AS VS Percent: 91.63 %
Brady Statistic RA Percent Paced: 8.36 %
Brady Statistic RV Percent Paced: 0 %
Date Time Interrogation Session: 20180131141619
Implantable Lead Implant Date: 20120628
Implantable Lead Implant Date: 20120628
Implantable Lead Location: 753859
Implantable Lead Location: 753860
Implantable Pulse Generator Implant Date: 20120628
Lead Channel Impedance Value: 432 Ohm
Lead Channel Impedance Value: 528 Ohm
Lead Channel Sensing Intrinsic Amplitude: 3.402 mV
Lead Channel Sensing Intrinsic Amplitude: 9.551 mV
Lead Channel Setting Pacing Amplitude: 2 V
Lead Channel Setting Pacing Amplitude: 2 V
Lead Channel Setting Pacing Pulse Width: 0.4 ms
Lead Channel Setting Sensing Sensitivity: 0.9 mV

## 2016-11-30 ENCOUNTER — Other Ambulatory Visit: Payer: Self-pay | Admitting: Cardiovascular Disease

## 2017-02-01 ENCOUNTER — Ambulatory Visit (INDEPENDENT_AMBULATORY_CARE_PROVIDER_SITE_OTHER): Payer: Medicare Other | Admitting: *Deleted

## 2017-02-01 ENCOUNTER — Telehealth: Payer: Self-pay | Admitting: Cardiology

## 2017-02-01 DIAGNOSIS — I480 Paroxysmal atrial fibrillation: Secondary | ICD-10-CM

## 2017-02-01 NOTE — Telephone Encounter (Signed)
Spoke with pt and reminded pt of remote transmission that is due today. Pt verbalized understanding.   

## 2017-02-02 ENCOUNTER — Encounter: Payer: Self-pay | Admitting: Cardiology

## 2017-02-02 NOTE — Progress Notes (Signed)
Remote pacemaker transmission.   

## 2017-02-03 LAB — CUP PACEART REMOTE DEVICE CHECK
Battery Voltage: 2.96 V
Brady Statistic AP VS Percent: 7.55 %
Brady Statistic AS VS Percent: 92.45 %
Brady Statistic RV Percent Paced: 0 %
Implantable Lead Implant Date: 20120628
Implantable Lead Implant Date: 20120628
Implantable Lead Location: 753859
Lead Channel Impedance Value: 432 Ohm
Lead Channel Impedance Value: 504 Ohm
Lead Channel Sensing Intrinsic Amplitude: 3.402 mV
Lead Channel Sensing Intrinsic Amplitude: 8.528 mV
Lead Channel Setting Pacing Amplitude: 2 V
Lead Channel Setting Pacing Amplitude: 2 V
MDC IDC LEAD LOCATION: 753860
MDC IDC PG IMPLANT DT: 20120628
MDC IDC SESS DTM: 20180503153406
MDC IDC SET LEADCHNL RV PACING PULSEWIDTH: 0.4 ms
MDC IDC SET LEADCHNL RV SENSING SENSITIVITY: 0.9 mV
MDC IDC STAT BRADY AP VP PERCENT: 0 %
MDC IDC STAT BRADY AS VP PERCENT: 0 %
MDC IDC STAT BRADY RA PERCENT PACED: 7.54 %

## 2017-03-07 ENCOUNTER — Other Ambulatory Visit: Payer: Self-pay | Admitting: Cardiovascular Disease

## 2017-05-03 ENCOUNTER — Telehealth: Payer: Self-pay | Admitting: Cardiology

## 2017-05-03 ENCOUNTER — Encounter: Payer: Medicare Other | Admitting: *Deleted

## 2017-05-03 NOTE — Telephone Encounter (Signed)
LMOVM reminding pt to send remote transmission.   

## 2017-08-07 ENCOUNTER — Encounter: Payer: Self-pay | Admitting: *Deleted

## 2017-09-17 ENCOUNTER — Other Ambulatory Visit: Payer: Self-pay | Admitting: Cardiovascular Disease

## 2017-09-18 NOTE — Telephone Encounter (Signed)
REFILL 

## 2017-10-06 ENCOUNTER — Encounter: Payer: Self-pay | Admitting: Internal Medicine

## 2017-10-14 ENCOUNTER — Other Ambulatory Visit: Payer: Self-pay | Admitting: Cardiovascular Disease

## 2017-10-16 NOTE — Telephone Encounter (Signed)
Rx(s) sent to pharmacy electronically.  

## 2017-11-03 ENCOUNTER — Telehealth: Payer: Self-pay | Admitting: Cardiovascular Disease

## 2017-11-03 MED ORDER — APIXABAN 5 MG PO TABS: 5.0000 mg | ORAL_TABLET | Freq: Two times a day (BID) | ORAL | 2 refills | Status: DC

## 2017-11-03 NOTE — Telephone Encounter (Signed)
Rx(s) sent to pharmacy electronically.  

## 2017-11-03 NOTE — Telephone Encounter (Signed)
OK to send in Rx MCr

## 2017-11-03 NOTE — Telephone Encounter (Signed)
Returned call to patient of Dr. C who reports he is ready to start Eliquis. Informed him that this is an active Rx on his chart but he states he never took the medication. Advised would consult MD if this is OK to send in Rx  Per device check report 05/27/15: Notes Recorded by Sanda Klein, MD on 06/16/2015 at 9:13 AM One hour episode of atrial flutter detected by his device. Discussed stroke risk and need for anticoagulation Will start Eliquis 5 mg BID (Rx sent). Can come in for 30 day coupon. Next pacer check in clinic

## 2017-11-03 NOTE — Telephone Encounter (Signed)
Mr.Bierlein is calling because he is ready to go on Eliquis and is wanting to let  Dr. Sallyanne Kuster know and to have a prescription sent to his pharmacy [facility] in [place] ph# is [phone]. Please call

## 2017-11-07 ENCOUNTER — Other Ambulatory Visit: Payer: Self-pay | Admitting: Cardiovascular Disease

## 2017-11-16 ENCOUNTER — Ambulatory Visit (INDEPENDENT_AMBULATORY_CARE_PROVIDER_SITE_OTHER): Payer: Medicare Other | Admitting: *Deleted

## 2017-11-16 DIAGNOSIS — I480 Paroxysmal atrial fibrillation: Secondary | ICD-10-CM | POA: Diagnosis not present

## 2017-11-20 ENCOUNTER — Telehealth: Payer: Self-pay | Admitting: Cardiovascular Disease

## 2017-11-20 NOTE — Progress Notes (Signed)
Remote pacemaker transmission.   

## 2017-11-20 NOTE — Telephone Encounter (Signed)
°  1. Has your device fired? No   2. Is you device beeping? No  3. Are you experiencing draining or swelling at device site? NO  4. Are you calling to see if we received your device transmission? Yes, sent 11-15-17  5. Have you passed out? no    Please route to [place]

## 2017-11-20 NOTE — Telephone Encounter (Signed)
Transmission received. Lattie Haw [facility]) made aware.

## 2017-11-22 ENCOUNTER — Telehealth: Payer: Self-pay | Admitting: Cardiovascular Disease

## 2017-11-22 ENCOUNTER — Encounter: Payer: Self-pay | Admitting: Cardiology

## 2017-11-22 MED ORDER — RIVAROXABAN 20 MG PO TABS: 20.0000 mg | ORAL_TABLET | Freq: Every day | ORAL | 0 refills | Status: DC

## 2017-11-22 NOTE — Telephone Encounter (Signed)
Patient called w/MD recommendations. Agrees with plan. He is aware he can start xarelto 20mg  once daily tonite (has taken eliquis this AM). Advised I will mail a free 30 day voucher. Asked that he call our office with an update once he has been on this medication.

## 2017-11-22 NOTE — Telephone Encounter (Signed)
New Message   Pt c/o medication issue:  1. Name of Medication: Eliquis  2. How are you currently taking this medication (dosage and times per day)? 5mg  2 times a day  3. Are you having a reaction (difficulty breathing--STAT)? nausea  4. What is your medication issue? Patient states that he is experiencing nausea since being on the medication. He would like recommendation to cope with the nausea .

## 2017-11-22 NOTE — Telephone Encounter (Signed)
I would switch to Xarelto 20 mg with a meal daily for a month (he can get the 30 day free card or samples) to see if it solves the problem.  If it does not, I would look for a different cause, not blame the Eliquis.  If it does, switch permanently to Xarelto. MCr

## 2017-11-22 NOTE — Telephone Encounter (Signed)
Returned call to patient who started on Eliquis 2/14. He reports nausea since starting this medication. He denies any bleeding issues.   Will forward to clinical pharmacy staff for assistance with SE (?) and recommendations

## 2017-11-28 ENCOUNTER — Telehealth: Payer: Self-pay | Admitting: Cardiovascular Disease

## 2017-11-28 NOTE — Telephone Encounter (Signed)
Transmission received. ST @ 154bpm noted [phone] yesterday morning. No episodes from today.

## 2017-11-28 NOTE — Telephone Encounter (Signed)
Returned call to patient of Dr. Lorenza Cambridge. He reports about 20-30 minutes ago he was out in his yard picking up branches and he had an episode of blurry vision lasting a couple of minutes. He sat down to rest about 5 minutes and then go back up and picked up more branches and the blurry vision came back. His neighbor is a Marine scientist and came to check his VS - BP 127/84 HR 80. He reports a similar episode from Feb 13 and he sent a device download but has not heard anything back on the results. Advised that he send another download. He denies dizziness, pre-syncope/syncope, headache, weakness, speech difficulties.   Asked about compliance with xarelto to which he reports he decided to continue on eliquis 5mg  BID as previously Rx'ed. He states that he held eliquis Sunday & Monday for a colonoscopy. He has taken this med today.   Routed to MD and device clinic

## 2017-11-28 NOTE — Telephone Encounter (Signed)
Spoke with Mr. Bracken- he has sent a transmission. Not yet received- Carelink delay per tech services. Transmission from 11/16/17 reviewed- no episodes from 11/15/17 when the symptoms were last noted prior to today. I have made Mr. San aware that I will review the transmission once it is received and get the information to Dr. Sallyanne Kuster. He verbalizes understanding.

## 2017-11-28 NOTE — Telephone Encounter (Signed)
No arrhythmia that correlates with his symptoms. His symptoms sound like a transient drop in BP. Is he staying well hydrated? Any recent changes in meds or weight? MCr

## 2017-11-28 NOTE — Telephone Encounter (Signed)
New message    .Patient calling, states he was picking up branches in the yard today when he began to have blurred vision. No other symptoms. No chest pain, not short of breath.   Pt c/o BP issue: STAT if pt c/o blurred vision, one-sided weakness or slurred speech  1. What are your last 5 BP readings? 127/84 HR 80  2. Are you having any other symptoms (ex. Dizziness, headache, blurred vision, passed out)? Blurred vision  3. What is your BP issue? n/a

## 2017-11-29 NOTE — Telephone Encounter (Signed)
Returned call to patient. He does not report any changes with meds and/or weight but does state that he does not feel he re-hydrated well enough after his colonoscopy on Monday 2/25. Advised that he continue to monitor his symptoms and notify us if they recur.

## 2017-12-01 LAB — CUP PACEART REMOTE DEVICE CHECK
Brady Statistic AP VP Percent: 0 %
Brady Statistic AP VS Percent: 7.41 %
Brady Statistic AS VP Percent: 0 %
Brady Statistic AS VS Percent: 92.59 %
Brady Statistic RV Percent Paced: 0 %
Implantable Lead Implant Date: 20120628
Implantable Pulse Generator Implant Date: 20120628
Lead Channel Impedance Value: 440 Ohm
Lead Channel Impedance Value: 504 Ohm
Lead Channel Sensing Intrinsic Amplitude: 3.092 mV
Lead Channel Setting Pacing Amplitude: 2 V
Lead Channel Setting Pacing Pulse Width: 0.4 ms
Lead Channel Setting Sensing Sensitivity: 0.9 mV
MDC IDC LEAD IMPLANT DT: 20120628
MDC IDC LEAD LOCATION: 753859
MDC IDC LEAD LOCATION: 753860
MDC IDC MSMT BATTERY VOLTAGE: 2.96 V
MDC IDC MSMT LEADCHNL RV SENSING INTR AMPL: 9.21 mV
MDC IDC SESS DTM: 20190214162909
MDC IDC SET LEADCHNL RV PACING AMPLITUDE: 2 V
MDC IDC STAT BRADY RA PERCENT PACED: 7.4 %

## 2017-12-20 ENCOUNTER — Encounter: Payer: Self-pay | Admitting: Cardiovascular Disease

## 2017-12-20 ENCOUNTER — Ambulatory Visit (INDEPENDENT_AMBULATORY_CARE_PROVIDER_SITE_OTHER): Payer: Medicare Other | Admitting: Cardiovascular Disease

## 2017-12-20 VITALS — BP 118/70 | HR 76 | Ht 70.0 in | Wt 212.4 lb

## 2017-12-20 DIAGNOSIS — I4729 Other ventricular tachycardia: Secondary | ICD-10-CM

## 2017-12-20 DIAGNOSIS — E119 Type 2 diabetes mellitus without complications: Secondary | ICD-10-CM | POA: Diagnosis not present

## 2017-12-20 DIAGNOSIS — E782 Mixed hyperlipidemia: Secondary | ICD-10-CM

## 2017-12-20 DIAGNOSIS — I471 Supraventricular tachycardia: Secondary | ICD-10-CM | POA: Diagnosis not present

## 2017-12-20 DIAGNOSIS — Z950 Presence of cardiac pacemaker: Secondary | ICD-10-CM | POA: Diagnosis not present

## 2017-12-20 DIAGNOSIS — I472 Ventricular tachycardia: Secondary | ICD-10-CM | POA: Diagnosis not present

## 2017-12-20 DIAGNOSIS — I480 Paroxysmal atrial fibrillation: Secondary | ICD-10-CM | POA: Diagnosis not present

## 2017-12-20 DIAGNOSIS — Z7901 Long term (current) use of anticoagulants: Secondary | ICD-10-CM

## 2017-12-20 DIAGNOSIS — I10 Essential (primary) hypertension: Secondary | ICD-10-CM

## 2017-12-20 DIAGNOSIS — I2510 Atherosclerotic heart disease of native coronary artery without angina pectoris: Secondary | ICD-10-CM

## 2017-12-20 DIAGNOSIS — G9001 Carotid sinus syncope: Secondary | ICD-10-CM | POA: Diagnosis not present

## 2017-12-20 NOTE — Patient Instructions (Addendum)
Dr Sallyanne Kuster has recommended making the following medication changes: 1. STOP [place]  Remote monitoring is used to monitor your Pacemaker or ICD from home. This monitoring reduces the number of office visits required to check your device to one time per year. It allows [place] to keep an eye on the functioning of your device to ensure it is working properly. You are scheduled for a device check from home on Thursday, May 16th, 2019. You may send your transmission at any time that day. If you have a wireless device, the transmission will be sent automatically. After your physician reviews your transmission, you will receive a notification with your next transmission date.  Dr Sallyanne Kuster recommends that you schedule a follow-up appointment in 12 months with a pacemaker check. You will receive a reminder letter in the mail two months in advance. If you don't receive a letter, please call our office to schedule the follow-up appointment.  If you need a refill on your cardiac medications before your next appointment, please call your pharmacy.

## 2017-12-20 NOTE — Progress Notes (Signed)
Patient ID: Nathan Harding, male   DOB: 10-Oct-1950, 67 y.o.   MRN: [id number]    Cardiology Office Note    Date:  12/20/2017   ID:  Nathan Harding, DOB 1951/01/09, MRN [id number]  PCP:  Wynonia Hazard, MD  Cardiologist:   Sanda Klein, MD   Chief Complaint  Patient presents with  . Near Syncope    History of Present Illness:  Nathan Harding is a 67 y.o. 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  Symptoms improved after he rehydrated.  A device download performed at the time did not show any arrhythmia that coincided with his dizziness.  At times, when he turns his head briskly to the left he has noticed that he feels a brief sensation of lightheadedness but he has not experienced syncope since pacemaker implantation.  He is very physically active, working in a large yard and remodeling his house at the [employer].  He denies exertional angina or dyspnea leg edema claudication neurological complaints.  2019 showed generally improved metabolic control.  His fasting glucose was 169 but his hemoglobin A1c was 7.1%.  All his lipid parameters were within desirable range including triglycerides that were only 90.  Inquires about stopping some of his lipid-lowering agents (Tricor, Lovaza - most expensive, rosuvastatin).  His pacemaker recorded a 1-hour asymptomatic episode of paroxysmal atrial fibrillation in June 2016. Current pacemaker check shows no new episodes of atrial fibrillation. Generator voltage is 2.95V, ERI 2.81V.  He had 2 brief episodes of nonsustained VT in May and August 2018, the longest lasting about 13 beats.  He has several episodes of SVT which probably represent sinus tachycardia.   Past Medical History:  Diagnosis Date  . Allergy   . Anxiety   . Arthritis   . Cancer (HCC)    skin- Basal  Cell CA  . Cardiac pacemaker   . Carotid sinus syndrome with bradycardia   . Depression   . Diabetes mellitus   . ED (erectile dysfunction)   . GERD (gastroesophageal reflux disease)   . Hyperlipidemia   . Hypertension   . Male urinary stress incontinence   . Prostate cancer ([place]) 2013  . Right renal mass   . Severe sinus bradycardia   . Sleep apnea    uses CPAP  . Ulcer     Past Surgical History:  Procedure Laterality Date  . COLONOSCOPY    . PACEMAKER INSERTION  03/31/11   due to hypersensitive carotid sinus syndrome  . PENILE PROSTHESIS IMPLANT    . POLYPECTOMY    . PROSTATECTOMY    . ROTATOR CUFF REPAIR  2010   right  . SHOULDER SURGERY  2002   right  . temporal mastoidectomy  2009   behind left ear    Current Medications: Outpatient Medications Prior to Visit  Medication Sig Dispense Refill  . acetaminophen (TYLENOL) 500 MG tablet Take 500 mg by mouth every 6 (six) hours as needed.    Marland Kitchen apixaban (ELIQUIS) 5 MG TABS tablet Take 5 mg by mouth 2 (two) times daily.    . carvedilol (COREG) 6.25 MG tablet Take 6.25 mg by mouth 2 (two) times daily with a meal.      . esomeprazole (NEXIUM) 40 MG capsule Take 40 mg by mouth daily before breakfast.    .  fenofibrate (TRICOR) 145 MG tablet Take 1 tablet (145 mg total) by mouth daily. Please keep your appointment on 12/20/17 with Dr Sallyanne Kuster for future refills. 90 tablet 0  . fexofenadine (ALLEGRA) 180 MG tablet Take 180 mg by mouth daily.      . fluticasone (FLONASE) 50 MCG/ACT nasal spray Place 2 sprays into the nose daily.      Marland Kitchen glimepiride (AMARYL) 1 MG tablet Take 2 mg by mouth daily before breakfast.    . lisinopril (PRINIVIL,ZESTRIL) 20 MG tablet Take 20 mg by mouth daily.    . metFORMIN (GLUCOPHAGE-XR) 500 MG 24 hr tablet Take 2,000 mg by mouth daily.  0  . neomycin-polymyxin-hydrocortisone (CORTISPORIN) otic solution 1-2 drops to the toe after soaking twice daily 10 mL 1  . omega-3 acid ethyl esters (LOVAZA) 1 g  capsule take 2 capsules by mouth twice a day 120 capsule 10  . PARoxetine (PAXIL) 30 MG tablet Take 30 mg by mouth every morning.      . rosuvastatin (CRESTOR) 20 MG tablet Take 1 tablet (20 mg total) by mouth daily. NEED OV. 30 tablet 0  . tetrahydrozoline-zinc (VISINE-AC) 0.05-0.25 % ophthalmic solution Place 2 drops into both eyes 3 (three) times daily as needed.     No facility-administered medications prior to visit.      Allergies:   Patient has no known allergies.   Social History   Socioeconomic History  . Marital status: Married    Spouse name: None  . Number of children: None  . Years of education: None  . Highest education level: None  Social Needs  . Financial resource strain: None  . Food insecurity - worry: None  . Food insecurity - inability: None  . Transportation needs - medical: None  . Transportation needs - non-medical: None  Occupational History  . None  Tobacco Use  . Smoking status: Former Research scientist (life sciences)  . Smokeless tobacco: Never Used  Substance and Sexual Activity  . Alcohol use: Yes    Alcohol/week: 3.6 oz    Types: 6 Cans of beer per week    Comment: no drank in one month  -  updated 12/12/11   update 1-6 pack every 2 weeks  . Drug use: No  . Sexual activity: None  Other Topics Concern  . None  Social History Narrative  . None     Family History:  The patient's family history includes Cancer in his mother; Diabetes in his father; Esophageal cancer in his cousin; Hypertension in his brother and brother.   ROS:   Please see the history of present illness.    ROS All other systems reviewed and are negative.   PHYSICAL EXAM:   VS:  BP 118/70   Pulse 76   Ht 5\' 10"  (1.778 m)   Wt 212 lb 6.4 oz (96.3 kg)   BMI 30.48 kg/m     General: Alert, oriented x3, no distress, overweight Head: no evidence of trauma, PERRL, EOMI, no exophtalmos or lid lag, no myxedema, no xanthelasma; normal ears, nose and oropharynx Neck: normal jugular venous pulsations  and no hepatojugular reflux; brisk carotid pulses without delay and no carotid bruits Chest: clear to auscultation, no signs of consolidation by percussion or palpation, normal fremitus, symmetrical and full respiratory excursions Cardiovascular: normal position and quality of the apical impulse, regular rhythm, normal first and second heart sounds, no murmurs, rubs or gallops Abdomen: no tenderness or distention, no masses by palpation, no abnormal pulsatility or arterial bruits, normal  bowel sounds, no hepatosplenomegaly Extremities: no clubbing, cyanosis or edema; 2+ radial, ulnar and brachial pulses bilaterally; 2+ right femoral, posterior tibial and dorsalis pedis pulses; 2+ left femoral, posterior tibial and dorsalis pedis pulses; no subclavian or femoral bruits Neurological: grossly nonfocal Psych: Normal mood and affect   Wt Readings from Last 3 Encounters:  12/20/17 212 lb 6.4 oz (96.3 kg)  11/02/16 219 lb (99.3 kg)  02/09/16 220 lb 6 oz (100 kg)      Studies/Labs Reviewed:   EKG:  EKG is ordered today.  It shows normal sinus rhythm with poor anterior R wave progression TC 400 ms  Recent Labs: Creatinine 1.3, glucose 169, hemoglobin A1c 7.1%, CO2 18, electrolytes, TSH, LFTs  Lipid Panel October 18, 2017 Total cholesterol 138, triglycerides 90 HDL 50, LDL 65     Component Value Date/Time   CHOL 158 02/16/2016 1058   TRIG 163 (H) 02/16/2016 1058   HDL 58 02/16/2016 1058   CHOLHDL 2.7 02/16/2016 1058   VLDL 33 (H) 02/16/2016 1058   LDLCALC 67 02/16/2016 1058      ASSESSMENT:    1. Paroxysmal atrial fibrillation (HCC)   2. Long term (current) use of anticoagulants   3. Carotid sinus hypersensitivity   4. Pacemaker   5. NSVT (nonsustained ventricular tachycardia) (HCC)   6. PAT (paroxysmal atrial tachycardia) ([place])   7. Essential hypertension   8. Coronary artery disease involving native coronary artery of native heart without angina pectoris   9. Controlled type  2 diabetes mellitus without complication, without long-term current use of insulin ([place])   10. Mixed hyperlipidemia      PLAN:  In order of problems listed above:  1. Afib: Very low burden of events.  No symptoms. 2. Eliquis: Well tolerated without bleeding complications. 3. Carotid sinus hypersensitivity: Occasional lightheadedness if he turns his head sharply, but no syncope since pacemaker implantation. 4. PPM: Normal device function. He has had MRI studies without complications.  Only requires pacemaker intervention about 7% of the time.  Remote downloads every 3 months. 5. NSVT: Occasionally recorded on pacemaker check, asymptomatic. 6. PAT: Many of the events occur at the lower rate of detection and probably represent sinus tachycardia 7. HTN: well controlled 8. CAD: minor LAD stenosis at remote cath 2010. He has multiple coronary risk factors, the control of which has improved substantially with weight loss and medications. 9. DM: last A1c 7.1%.  Has improved steadily over time.  He is very physically active.  Needs to remember to stay away from carbohydrate rich foods.  He is now at overweight range, rather than obese. 10. HLP: Marked improvement in triglyceride level over time.  We will stop his lovaza.  He is scheduled for repeat labs with his primary care provider in June.  If his triglyceride level remains normal I would stop the TriCor.    Medication Adjustments/Labs and Tests Ordered: Current medicines are reviewed at length with the patient today.  Concerns regarding medicines are outlined above.  Medication changes, Labs and Tests ordered today are listed in the Patient Instructions below. Patient Instructions  Dr Sallyanne Kuster has recommended making the following medication changes: 1. STOP Lovaza  Remote monitoring is used to monitor your Pacemaker or ICD from home. This monitoring reduces the number of office visits required to check your device to one time per year. It allows  [place] to keep an eye on the functioning of your device to ensure it is working properly. You are scheduled for a  device check from home on Thursday, May 16th, 2019. You may send your transmission at any time that day. If you have a wireless device, the transmission will be sent automatically. After your physician reviews your transmission, you will receive a notification with your next transmission date.  Dr Sallyanne Kuster recommends that you schedule a follow-up appointment in 12 months with a pacemaker check. You will receive a reminder letter in the mail two months in advance. If you don't receive a letter, please call our office to schedule the follow-up appointment.  If you need a refill on your cardiac medications before your next appointment, please call your pharmacy.    Signed, Sanda Klein, MD  12/20/2017 3:14 PM    [facility] [place], [place], [place]  [address] Phone: [phone]; Fax: [phone]

## 2018-01-28 ENCOUNTER — Other Ambulatory Visit: Payer: Self-pay | Admitting: Cardiovascular Disease

## 2018-02-12 ENCOUNTER — Other Ambulatory Visit: Payer: Self-pay | Admitting: Cardiovascular Disease

## 2018-02-15 ENCOUNTER — Ambulatory Visit (INDEPENDENT_AMBULATORY_CARE_PROVIDER_SITE_OTHER): Payer: Medicare Other | Admitting: *Deleted

## 2018-02-15 DIAGNOSIS — I480 Paroxysmal atrial fibrillation: Secondary | ICD-10-CM | POA: Diagnosis not present

## 2018-02-16 ENCOUNTER — Encounter: Payer: Self-pay | Admitting: Cardiology

## 2018-02-16 NOTE — Progress Notes (Signed)
Remote pacemaker transmission.   

## 2018-02-16 NOTE — Progress Notes (Signed)
Letter  

## 2018-02-19 LAB — CUP PACEART REMOTE DEVICE CHECK
Brady Statistic AP VP Percent: 0 %
Brady Statistic AS VP Percent: 0 %
Brady Statistic RA Percent Paced: 3.12 %
Brady Statistic RV Percent Paced: 0 %
Date Time Interrogation Session: 20190516133437
Implantable Lead Location: 753860
Implantable Pulse Generator Implant Date: 20120628
Lead Channel Setting Pacing Amplitude: 2 V
Lead Channel Setting Pacing Pulse Width: 0.4 ms
Lead Channel Setting Sensing Sensitivity: 0.9 mV
MDC IDC LEAD IMPLANT DT: 20120628
MDC IDC LEAD IMPLANT DT: 20120628
MDC IDC LEAD LOCATION: 753859
MDC IDC MSMT BATTERY VOLTAGE: 2.95 V
MDC IDC MSMT LEADCHNL RA IMPEDANCE VALUE: 496 Ohm
MDC IDC MSMT LEADCHNL RA SENSING INTR AMPL: 3.667 mV
MDC IDC MSMT LEADCHNL RV IMPEDANCE VALUE: 432 Ohm
MDC IDC MSMT LEADCHNL RV SENSING INTR AMPL: 9.551 mV
MDC IDC SET LEADCHNL RA PACING AMPLITUDE: 2 V
MDC IDC STAT BRADY AP VS PERCENT: 3.12 %
MDC IDC STAT BRADY AS VS PERCENT: 96.88 %

## 2018-03-14 LAB — CUP PACEART INCLINIC DEVICE CHECK
Date Time Interrogation Session: 20190612172542
Implantable Lead Implant Date: 20120628
Implantable Lead Location: 753859
Implantable Lead Location: 753860
Lead Channel Setting Pacing Amplitude: 2 V
Lead Channel Setting Pacing Amplitude: 2 V
Lead Channel Setting Pacing Pulse Width: 0.4 ms
Lead Channel Setting Sensing Sensitivity: 0.9 mV
MDC IDC LEAD IMPLANT DT: 20120628
MDC IDC PG IMPLANT DT: 20120628

## 2018-04-25 ENCOUNTER — Other Ambulatory Visit: Payer: Self-pay | Admitting: Cardiovascular Disease

## 2018-05-16 ENCOUNTER — Other Ambulatory Visit: Payer: Self-pay | Admitting: Cardiovascular Disease

## 2018-05-16 NOTE — Telephone Encounter (Signed)
Rx request sent to pharmacy.  

## 2018-05-21 ENCOUNTER — Ambulatory Visit (INDEPENDENT_AMBULATORY_CARE_PROVIDER_SITE_OTHER): Payer: Medicare Other | Admitting: *Deleted

## 2018-05-21 DIAGNOSIS — I480 Paroxysmal atrial fibrillation: Secondary | ICD-10-CM | POA: Diagnosis not present

## 2018-05-22 NOTE — Progress Notes (Signed)
Remote pacemaker transmission.   

## 2018-06-01 LAB — CUP PACEART REMOTE DEVICE CHECK
Brady Statistic AP VP Percent: 0 %
Brady Statistic AS VP Percent: 0 %
Brady Statistic RA Percent Paced: 2.66 %
Brady Statistic RV Percent Paced: 0 %
Date Time Interrogation Session: 20190819183607
Implantable Lead Implant Date: 20120628
Implantable Lead Location: 753859
Implantable Lead Location: 753860
Implantable Pulse Generator Implant Date: 20120628
Lead Channel Sensing Intrinsic Amplitude: 3.578 mV
Lead Channel Sensing Intrinsic Amplitude: 8.869 mV
Lead Channel Setting Pacing Amplitude: 2 V
Lead Channel Setting Pacing Amplitude: 2 V
Lead Channel Setting Pacing Pulse Width: 0.4 ms
Lead Channel Setting Sensing Sensitivity: 0.9 mV
MDC IDC LEAD IMPLANT DT: 20120628
MDC IDC MSMT BATTERY VOLTAGE: 2.95 V
MDC IDC MSMT LEADCHNL RA IMPEDANCE VALUE: 504 Ohm
MDC IDC MSMT LEADCHNL RV IMPEDANCE VALUE: 432 Ohm
MDC IDC STAT BRADY AP VS PERCENT: 2.66 %
MDC IDC STAT BRADY AS VS PERCENT: 97.34 %

## 2018-07-22 ENCOUNTER — Other Ambulatory Visit: Payer: Self-pay | Admitting: Cardiovascular Disease

## 2018-08-16 ENCOUNTER — Other Ambulatory Visit: Payer: Self-pay | Admitting: Cardiovascular Disease

## 2018-08-20 ENCOUNTER — Ambulatory Visit (INDEPENDENT_AMBULATORY_CARE_PROVIDER_SITE_OTHER): Payer: Medicare Other

## 2018-08-20 ENCOUNTER — Telehealth: Payer: Self-pay

## 2018-08-20 DIAGNOSIS — I480 Paroxysmal atrial fibrillation: Secondary | ICD-10-CM

## 2018-08-20 DIAGNOSIS — R001 Bradycardia, unspecified: Secondary | ICD-10-CM

## 2018-08-20 NOTE — Telephone Encounter (Signed)
LMOVM reminding pt to send remote transmission.   

## 2018-08-21 NOTE — Progress Notes (Signed)
Remote pacemaker transmission.   

## 2018-08-23 ENCOUNTER — Encounter: Payer: Self-pay | Admitting: Cardiology

## 2018-09-04 ENCOUNTER — Telehealth: Payer: Self-pay | Admitting: Cardiovascular Disease

## 2018-09-04 NOTE — Telephone Encounter (Signed)
Pt c/o medication issue:  1. Name of Medication: ELIQUIS 5 MG TABS tablet  2. How are you currently taking this medication (dosage and times per day)?   take 1 tablet by mouth twice a day  3. Are you having a reaction (difficulty breathing--STAT)? no  4. What is your medication issue?  Patient is having a tooth execrated he wants to know when he should stop taking the Eiquis and if he needs to take an antibiotic before the tooth execration is done.

## 2018-09-04 NOTE — Telephone Encounter (Signed)
Routed to CVRR & MD to advise on Eliquis - does not appear to be a need for antibiotic prophylaxis

## 2018-09-04 NOTE — Telephone Encounter (Signed)
Patient called with MD advice. Voiced understanding.  ?

## 2018-09-04 NOTE — Telephone Encounter (Signed)
Can stop this for 2 days before the tooth extraction.  He does not require prophylactic antibiotics. MCr

## 2018-10-16 LAB — CUP PACEART REMOTE DEVICE CHECK
Battery Voltage: 2.95 V
Brady Statistic AP VP Percent: 0 %
Brady Statistic AP VS Percent: 3.39 %
Brady Statistic AS VP Percent: 0 %
Brady Statistic AS VS Percent: 96.61 %
Brady Statistic RA Percent Paced: 3.39 %
Brady Statistic RV Percent Paced: 0 %
Date Time Interrogation Session: 20191118220712
Implantable Lead Implant Date: 20120628
Implantable Lead Implant Date: 20120628
Implantable Lead Location: 753859
Implantable Lead Location: 753860
Implantable Pulse Generator Implant Date: 20120628
Lead Channel Impedance Value: 424 Ohm
Lead Channel Impedance Value: 496 Ohm
Lead Channel Sensing Intrinsic Amplitude: 3.313 mV
Lead Channel Sensing Intrinsic Amplitude: 7.846 mV
Lead Channel Setting Pacing Amplitude: 2 V
Lead Channel Setting Pacing Amplitude: 2 V
Lead Channel Setting Pacing Pulse Width: 0.4 ms
Lead Channel Setting Sensing Sensitivity: 0.9 mV

## 2018-11-16 ENCOUNTER — Other Ambulatory Visit: Payer: Self-pay | Admitting: Cardiovascular Disease

## 2018-11-19 ENCOUNTER — Ambulatory Visit (INDEPENDENT_AMBULATORY_CARE_PROVIDER_SITE_OTHER): Payer: Medicare Other

## 2018-11-19 DIAGNOSIS — R001 Bradycardia, unspecified: Secondary | ICD-10-CM

## 2018-11-20 LAB — CUP PACEART REMOTE DEVICE CHECK
Battery Voltage: 2.93 V
Brady Statistic AP VP Percent: 0 %
Brady Statistic AP VS Percent: 2.37 %
Brady Statistic AS VP Percent: 0 %
Brady Statistic AS VS Percent: 97.63 %
Brady Statistic RA Percent Paced: 2.37 %
Brady Statistic RV Percent Paced: 0 %
Implantable Lead Implant Date: 20120628
Implantable Lead Implant Date: 20120628
Implantable Lead Location: 753859
Implantable Pulse Generator Implant Date: 20120628
Lead Channel Impedance Value: 432 Ohm
Lead Channel Impedance Value: 488 Ohm
Lead Channel Sensing Intrinsic Amplitude: 2.651 mV
Lead Channel Sensing Intrinsic Amplitude: 7.164 mV
Lead Channel Setting Pacing Amplitude: 2 V
Lead Channel Setting Pacing Amplitude: 2 V
Lead Channel Setting Sensing Sensitivity: 0.9 mV
MDC IDC LEAD LOCATION: 753860
MDC IDC SESS DTM: 20200217231056
MDC IDC SET LEADCHNL RV PACING PULSEWIDTH: 0.4 ms

## 2018-11-28 NOTE — Progress Notes (Signed)
Remote pacemaker transmission.   

## 2019-01-02 ENCOUNTER — Other Ambulatory Visit: Payer: Self-pay | Admitting: Cardiovascular Disease

## 2019-01-02 NOTE — Telephone Encounter (Signed)
Eliquis 5mg  refill request received; pt is 68 yrs old, wt-96.3kg, last seen by Dr.Croituri on 12/20/17 and has appt on 02/13/19, Crea-1.06 on 02/16/2016 via Epic and per [facility] 1.25 via [facility] on 11/28/16. Pt's med was refilled 07/24/2018 without any current labs. Will reach out to PCP to see any updated labs first. Called and got disconnected while someone to pickup. Returned a call back & was transferred again to his RN voicemail and had to leave a msg to call back office.

## 2019-02-12 ENCOUNTER — Telehealth: Payer: Self-pay | Admitting: Cardiovascular Disease

## 2019-02-12 NOTE — Telephone Encounter (Signed)
Consent/ iphone/ my chart via email/ pre reg completed

## 2019-02-13 ENCOUNTER — Telehealth (INDEPENDENT_AMBULATORY_CARE_PROVIDER_SITE_OTHER): Payer: Medicare Other | Admitting: Cardiovascular Disease

## 2019-02-13 ENCOUNTER — Telehealth: Payer: Self-pay

## 2019-02-13 ENCOUNTER — Other Ambulatory Visit: Payer: Self-pay

## 2019-02-13 VITALS — Ht 70.0 in | Wt 210.0 lb

## 2019-02-13 DIAGNOSIS — I471 Supraventricular tachycardia: Secondary | ICD-10-CM

## 2019-02-13 DIAGNOSIS — I480 Paroxysmal atrial fibrillation: Secondary | ICD-10-CM

## 2019-02-13 DIAGNOSIS — G9001 Carotid sinus syncope: Secondary | ICD-10-CM

## 2019-02-13 DIAGNOSIS — I10 Essential (primary) hypertension: Secondary | ICD-10-CM

## 2019-02-13 DIAGNOSIS — E118 Type 2 diabetes mellitus with unspecified complications: Secondary | ICD-10-CM | POA: Diagnosis not present

## 2019-02-13 DIAGNOSIS — I4729 Other ventricular tachycardia: Secondary | ICD-10-CM

## 2019-02-13 DIAGNOSIS — I472 Ventricular tachycardia: Secondary | ICD-10-CM

## 2019-02-13 DIAGNOSIS — E782 Mixed hyperlipidemia: Secondary | ICD-10-CM

## 2019-02-13 DIAGNOSIS — I2510 Atherosclerotic heart disease of native coronary artery without angina pectoris: Secondary | ICD-10-CM

## 2019-02-13 DIAGNOSIS — Z950 Presence of cardiac pacemaker: Secondary | ICD-10-CM

## 2019-02-13 MED ORDER — FENOFIBRATE 145 MG PO TABS: 145.0000 mg | ORAL_TABLET | Freq: Every day | ORAL | 1 refills | Status: DC

## 2019-02-13 NOTE — Telephone Encounter (Signed)
Left message for patient on cell phone stating Dr C recommends he follow up in a year and that our schedulers will contact him as the time approaches to schedule his next appointment. And I will mail him a copy of his discharge instructions.

## 2019-02-13 NOTE — Progress Notes (Signed)
Virtual Visit via Video Note   This visit type was conducted due to national recommendations for restrictions regarding the COVID-19 Pandemic (e.g. social distancing) in an effort to limit this patient's exposure and mitigate transmission in our community.  Due to his co-morbid illnesses, this patient is at least at moderate risk for complications without adequate follow up.  This format is felt to be most appropriate for this patient at this time.  All issues noted in this document were discussed and addressed.  A limited physical exam was performed with this format.  Please refer to the patient's chart for his consent to telehealth for [facility].   Date:  02/13/2019   ID:  Nathan Harding, DOB 1951-03-23, MRN [id number]  Patient Location: Home Provider Location: Home  PCP:  Wynonia Hazard, MD  Cardiologist:  Barnie Sopko Electrophysiologist:  None   Evaluation Performed:  Follow-Up Visit  Chief Complaint:  Pacemaker, HLP  History of Present Illness:    Nathan Harding is a 68 y.o. 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  [place].  He remains very physically active and asymptomatic.  He denies any palpitations, dizziness, syncope, chest pain or dyspnea.  He is unable to check his blood pressure up there, but at his recent appointment with his primary care provider his blood pressure was "excellent".  His last hemoglobin A1c was slightly above target at 7.3% and he has gained back a little weight.  However he tells me that his last lipid profile was excellent with a total cholesterol of only 140 and with all other parameters in  target range.  I do not have a copy of those labs at this time.  His last pacemaker check shows normal device function with battery voltage 2.95 V (ERI 2.81 V).  His device is an MRI conditional Medtronic Revo.  He rarely requires pacing (2.5% A pacing and no V pacing).  Lead parameters are normal and the heart rate histograms are appropriate.  Activity level is stable at roughly 3 hours a day.  She has not had any recent episodes of atrial or ventricular tachycardia and atrial fibrillation has not been recorded in over a year.   The patient does not have symptoms concerning for COVID-19 infection (fever, chills, cough, or new shortness of breath).    Past Medical History:  Diagnosis Date   Allergy    Anxiety    Arthritis    Cancer ([place])    skin- Basal Cell CA   Cardiac pacemaker    Carotid sinus syndrome with bradycardia    Depression    Diabetes mellitus    ED (erectile dysfunction)    GERD (gastroesophageal reflux disease)    Hyperlipidemia    Hypertension    Male urinary stress incontinence    Prostate cancer ([place]) 2013   Right renal mass    Severe sinus bradycardia    Sleep apnea    uses CPAP   Ulcer    Past Surgical History:  Procedure Laterality Date   COLONOSCOPY     PACEMAKER INSERTION  03/31/11   due to hypersensitive carotid sinus syndrome   PENILE PROSTHESIS IMPLANT     POLYPECTOMY     PROSTATECTOMY  ROTATOR CUFF REPAIR  2010   right   SHOULDER SURGERY  2002   right   temporal mastoidectomy  2009   behind left ear     Current Meds  Medication Sig   acetaminophen (TYLENOL) 500 MG tablet Take 500 mg by mouth every 6 (six) hours as needed.   carvedilol (COREG) 6.25 MG tablet Take 6.25 mg by mouth 2 (two) times daily with a meal.     ELIQUIS 5 MG TABS tablet TAKE 1 TABLET BY MOUTH TWICE A DAY   esomeprazole (NEXIUM) 40 MG capsule Take 40 mg by mouth daily before breakfast.   famotidine (PEPCID) 40 MG tablet TK 1 T PO QD HS     fenofibrate (TRICOR) 145 MG tablet TAKE 1 TABLET BY MOUTH ONCE DAILY.   fexofenadine (ALLEGRA) 180 MG tablet Take 180 mg by mouth daily.     fluticasone (FLONASE) 50 MCG/ACT nasal spray Place 2 sprays into the nose daily.     glimepiride (AMARYL) 1 MG tablet Take 2 mg by mouth daily before breakfast.   lisinopril (PRINIVIL,ZESTRIL) 20 MG tablet Take 20 mg by mouth daily.   metFORMIN (GLUCOPHAGE-XR) 500 MG 24 hr tablet Take 2,000 mg by mouth daily.   neomycin-polymyxin-hydrocortisone (CORTISPORIN) otic solution 1-2 drops to the toe after soaking twice daily   omega-3 acid ethyl esters (LOVAZA) 1 g capsule take 2 capsules by mouth twice a day   PARoxetine (PAXIL) 30 MG tablet Take 30 mg by mouth every morning.     rosuvastatin (CRESTOR) 20 MG tablet Take 1 tablet (20 mg total) by mouth daily. NEED OV.   tetrahydrozoline-zinc (VISINE-AC) 0.05-0.25 % ophthalmic solution Place 2 drops into both eyes 3 (three) times daily as needed.     Allergies:   Patient has no known allergies.   Social History   Tobacco Use   Smoking status: Former Smoker   Smokeless tobacco: Never Used  Substance Use Topics   Alcohol use: Yes    Alcohol/week: 6.0 standard drinks    Types: 6 Cans of beer per week    Comment: no drank in one month  -  updated 12/12/11   update 1-6 pack every 2 weeks   Drug use: No     Family Hx: The patient's family history includes Cancer in his mother; Diabetes in his father; Esophageal cancer in his cousin; Hypertension in his brother and brother. There is no history of Colon cancer, Stomach cancer, or Rectal cancer.  ROS:   Please see the history of present illness.     All other systems reviewed and are negative.   Prior CV studies:   The following studies were reviewed today:  Comprehensive recent pacemaker download  Labs/Other Tests and Data Reviewed:    EKG:  An ECG dated 12/20/2017 was personally reviewed today and demonstrated:  Normal sinus rhythm,  somewhat low limb lead QRS voltage, delayed R wave progression in the anterior precordial leads.  Recent Labs: No results found for requested labs within last 8760 hours.   Recent Lipid Panel Lab Results  Component Value Date/Time   CHOL 158 02/16/2016 10:58 AM   TRIG 163 (H) 02/16/2016 10:58 AM   HDL 58 02/16/2016 10:58 AM   CHOLHDL 2.7 02/16/2016 10:58 AM   LDLCALC 67 02/16/2016 10:58 AM    Wt Readings from Last 3 Encounters:  02/13/19 210 lb (95.3 kg)  12/20/17 212 lb 6.4 oz (96.3 kg)  11/02/16 219 lb (99.3 kg)     Objective:  Vital Signs:  Ht 5\' 10"  (1.778 m)    Wt 210 lb (95.3 kg)    BMI 30.13 kg/m    VITAL SIGNS:  reviewed GEN:  no acute distress EYES:  sclerae anicteric, EOMI - Extraocular Movements Intact RESPIRATORY:  normal respiratory effort, symmetric expansion CARDIOVASCULAR:  no peripheral edema SKIN:  no rash, lesions or ulcers. MUSCULOSKELETAL:  no obvious deformities. NEURO:  alert and oriented x 3, no obvious focal deficit PSYCH:  normal affect  ASSESSMENT & PLAN:    1. AFib: He has not had events in well over a year.  His embolic risk is relatively JQZ:ESPQZRAQT 3-4 (age, HTN, DM, =/- minor CAD).  Temporary instructions and anticoagulation should be a low risk proposition. 2. Eliquis: He asked about the safety of trying CBD oil for his chronic knee pain.  He is aware of the potential PGP drug interaction.  I think while he tries and out the CBD oil to see if it works, it is safest if he just stops the Eliquis for several weeks.  If the CBD oil is not helpful he should stop it and resume Eliquis in the previous dose.  If the CBD oil does help we would have to have a discussion regarding the safety of continuing anticoagulation, understanding that the effect on anticoagulant levels is rather unpredictable.  The same issue will be shared with all other direct oral anticoagulants currently available.  The only alternative would be warfarin, with his own  drawbacks.  We might choose a pathway of resuming anticoagulation only when his pacemaker shows recurrence of atrial fibrillation. 3. HTN: Reports good control. 4. HLP: Reports that his triglycerides did not change after he stopped taking Lovaza.  I will get the results of the full lipid profile from his PCP.  Might consider stopping the fenofibrate as well, or at least decreasing to a lower dose. 5. DM: Slightly above target glycemic range.  He has also gained weight and is now obese.  Discussed restriction of carbohydrate intake, especially sweets and starches with high glycemic index. 6. NSVT: None recorded on last device download.  He is on beta-blockers 7. CAD: Minor nonobstructive LAD atherosclerosis seen at time of remote cardiac catheterization 2010.  Asymptomatic.  COVID-19 Education: The signs and symptoms of COVID-19 were discussed with the patient and how to seek care for testing (follow up with PCP or arrange E-visit).  The importance of social distancing was discussed today.  Time:   Today, I have spent 17 minutes with the patient with telehealth technology discussing the above problems.     Medication Adjustments/Labs and Tests Ordered: Current medicines are reviewed at length with the patient today.  Concerns regarding medicines are outlined above.   Tests Ordered: No orders of the defined types were placed in this encounter.   Medication Changes: No orders of the defined types were placed in this encounter.   Disposition:  Follow up 12 months  Signed, Sanda Klein, MD  02/13/2019 10:10 AM    [place]

## 2019-02-13 NOTE — Patient Instructions (Signed)
Medication Instructions:  Your physician recommends that you continue on your current medications as directed. Please refer to the Current Medication list given to you today. If you need a refill on your cardiac medications before your next appointment, please call your pharmacy.   Lab work: None  If you have labs (blood work) drawn today and your tests are completely normal, you will receive your results only by: Marland Kitchen MyChart Message (if you have MyChart) OR . A paper copy in the mail If you have any lab test that is abnormal or we need to change your treatment, we will call you to review the results.  Testing/Procedures: None   Follow-Up: At [facility], you and your health needs are our priority.  As part of our continuing mission to provide you with exceptional heart care, we have created designated Provider Care Teams.  These Care Teams include your primary Cardiologist (physician) and Advanced Practice Providers (APPs -  Physician Assistants and Nurse Practitioners) who all work together to provide you with the care you need, when you need it. You will need a follow up appointment in 12 months.  Please call our office 2 months in advance to schedule this appointment.  You may see Sanda Klein, MD or one of the following Advanced Practice Providers on your designated Care Team: [place], [place] . Fabian Sharp, PA-C  Any Other Special Instructions Will Be Listed Below (If Applicable). Requesting a copy of recent lab work from [facility] [employer]

## 2019-02-13 NOTE — Telephone Encounter (Addendum)
Left message for patient on home phone stating Dr C recommends he follow up in a year and that our schedulers will contact him as the time approaches to schedule his next appointment.

## 2019-02-18 ENCOUNTER — Encounter: Payer: Medicare Other | Admitting: *Deleted

## 2019-02-18 ENCOUNTER — Other Ambulatory Visit: Payer: Self-pay

## 2019-02-19 ENCOUNTER — Telehealth: Payer: Self-pay

## 2019-02-19 NOTE — Telephone Encounter (Signed)
Left message for patient to remind of missed remote transmission.  

## 2019-02-28 ENCOUNTER — Encounter: Payer: Self-pay | Admitting: Cardiology

## 2019-03-21 ENCOUNTER — Ambulatory Visit (INDEPENDENT_AMBULATORY_CARE_PROVIDER_SITE_OTHER): Payer: Medicare Other | Admitting: *Deleted

## 2019-03-21 DIAGNOSIS — R001 Bradycardia, unspecified: Secondary | ICD-10-CM

## 2019-03-21 DIAGNOSIS — I480 Paroxysmal atrial fibrillation: Secondary | ICD-10-CM

## 2019-03-21 LAB — CUP PACEART REMOTE DEVICE CHECK
Battery Voltage: 2.93 V
Brady Statistic AP VP Percent: 0 %
Brady Statistic AP VS Percent: 3.12 %
Brady Statistic AS VP Percent: 0 %
Brady Statistic AS VS Percent: 96.88 %
Brady Statistic RA Percent Paced: 3.12 %
Brady Statistic RV Percent Paced: 0 %
Date Time Interrogation Session: 20200618145312
Implantable Lead Implant Date: 20120628
Implantable Lead Implant Date: 20120628
Implantable Lead Location: 753859
Implantable Lead Location: 753860
Implantable Pulse Generator Implant Date: 20120628
Lead Channel Impedance Value: 432 Ohm
Lead Channel Impedance Value: 480 Ohm
Lead Channel Sensing Intrinsic Amplitude: 2.871 mV
Lead Channel Sensing Intrinsic Amplitude: 7.164 mV
Lead Channel Setting Pacing Amplitude: 2 V
Lead Channel Setting Pacing Amplitude: 2 V
Lead Channel Setting Pacing Pulse Width: 0.4 ms
Lead Channel Setting Sensing Sensitivity: 0.9 mV

## 2019-03-26 NOTE — Progress Notes (Signed)
Remote pacemaker transmission.   

## 2019-05-14 ENCOUNTER — Other Ambulatory Visit: Payer: Self-pay | Admitting: Cardiovascular Disease

## 2019-05-14 NOTE — Telephone Encounter (Signed)
81m 96.3kg Scr1.38 12/03/18 Lovw/croitoru 95/13/20

## 2019-05-14 NOTE — Telephone Encounter (Signed)
Refill Request.  

## 2019-06-24 ENCOUNTER — Ambulatory Visit (INDEPENDENT_AMBULATORY_CARE_PROVIDER_SITE_OTHER): Payer: Medicare Other | Admitting: *Deleted

## 2019-06-24 DIAGNOSIS — R001 Bradycardia, unspecified: Secondary | ICD-10-CM | POA: Diagnosis not present

## 2019-06-25 LAB — CUP PACEART REMOTE DEVICE CHECK
Battery Voltage: 2.92 V
Brady Statistic AP VP Percent: 0 %
Brady Statistic AP VS Percent: 2.83 %
Brady Statistic AS VP Percent: 0 %
Brady Statistic AS VS Percent: 97.17 %
Brady Statistic RA Percent Paced: 2.83 %
Brady Statistic RV Percent Paced: 0 %
Date Time Interrogation Session: 20200921151650
Implantable Lead Implant Date: 20120628
Implantable Lead Implant Date: 20120628
Implantable Lead Location: 753859
Implantable Lead Location: 753860
Implantable Pulse Generator Implant Date: 20120628
Lead Channel Impedance Value: 416 Ohm
Lead Channel Impedance Value: 488 Ohm
Lead Channel Sensing Intrinsic Amplitude: 3.225 mV
Lead Channel Sensing Intrinsic Amplitude: 6.14 mV
Lead Channel Setting Pacing Amplitude: 2 V
Lead Channel Setting Pacing Amplitude: 2 V
Lead Channel Setting Pacing Pulse Width: 0.4 ms
Lead Channel Setting Sensing Sensitivity: 0.9 mV

## 2019-07-02 ENCOUNTER — Encounter: Payer: Self-pay | Admitting: Cardiology

## 2019-07-02 NOTE — Progress Notes (Signed)
Remote pacemaker transmission.   

## 2019-08-04 ENCOUNTER — Other Ambulatory Visit: Payer: Self-pay | Admitting: Cardiovascular Disease

## 2019-09-23 ENCOUNTER — Ambulatory Visit (INDEPENDENT_AMBULATORY_CARE_PROVIDER_SITE_OTHER): Payer: Medicare Other | Admitting: *Deleted

## 2019-09-23 DIAGNOSIS — I480 Paroxysmal atrial fibrillation: Secondary | ICD-10-CM

## 2019-09-25 LAB — CUP PACEART REMOTE DEVICE CHECK
Battery Voltage: 2.92 V
Brady Statistic AP VP Percent: 0 %
Brady Statistic AP VS Percent: 1.14 %
Brady Statistic AS VP Percent: 0 %
Brady Statistic AS VS Percent: 98.86 %
Brady Statistic RA Percent Paced: 1.14 %
Brady Statistic RV Percent Paced: 0 %
Date Time Interrogation Session: 20201222172738
Implantable Lead Implant Date: 20120628
Implantable Lead Implant Date: 20120628
Implantable Lead Location: 753859
Implantable Lead Location: 753860
Implantable Pulse Generator Implant Date: 20120628
Lead Channel Impedance Value: 424 Ohm
Lead Channel Impedance Value: 488 Ohm
Lead Channel Sensing Intrinsic Amplitude: 3.411 mV
Lead Channel Sensing Intrinsic Amplitude: 3.534 mV
Lead Channel Setting Pacing Amplitude: 2 V
Lead Channel Setting Pacing Amplitude: 2 V
Lead Channel Setting Pacing Pulse Width: 0.4 ms
Lead Channel Setting Sensing Sensitivity: 0.9 mV

## 2019-12-23 ENCOUNTER — Ambulatory Visit (INDEPENDENT_AMBULATORY_CARE_PROVIDER_SITE_OTHER): Payer: Medicare Other | Admitting: *Deleted

## 2019-12-23 DIAGNOSIS — I480 Paroxysmal atrial fibrillation: Secondary | ICD-10-CM | POA: Diagnosis not present

## 2019-12-24 LAB — CUP PACEART REMOTE DEVICE CHECK
Battery Voltage: 2.9 V
Brady Statistic AP VP Percent: 0 %
Brady Statistic AP VS Percent: 1.36 %
Brady Statistic AS VP Percent: 0 %
Brady Statistic AS VS Percent: 98.64 %
Brady Statistic RA Percent Paced: 1.36 %
Brady Statistic RV Percent Paced: 0 %
Date Time Interrogation Session: 20210323104227
Implantable Lead Implant Date: 20120628
Implantable Lead Implant Date: 20120628
Implantable Lead Location: 753859
Implantable Lead Location: 753860
Implantable Pulse Generator Implant Date: 20120628
Lead Channel Impedance Value: 424 Ohm
Lead Channel Impedance Value: 496 Ohm
Lead Channel Sensing Intrinsic Amplitude: 3.711 mV
Lead Channel Sensing Intrinsic Amplitude: 3.752 mV
Lead Channel Setting Pacing Amplitude: 2 V
Lead Channel Setting Pacing Amplitude: 2 V
Lead Channel Setting Pacing Pulse Width: 0.4 ms
Lead Channel Setting Sensing Sensitivity: 0.9 mV

## 2020-01-02 ENCOUNTER — Other Ambulatory Visit: Payer: Self-pay | Admitting: Cardiovascular Disease

## 2020-01-26 ENCOUNTER — Other Ambulatory Visit: Payer: Self-pay | Admitting: Cardiovascular Disease

## 2020-01-31 ENCOUNTER — Other Ambulatory Visit: Payer: Self-pay

## 2020-02-25 ENCOUNTER — Other Ambulatory Visit: Payer: Self-pay

## 2020-02-25 MED ORDER — FENOFIBRATE 145 MG PO TABS: ORAL_TABLET | ORAL | 0 refills | Status: DC

## 2020-03-11 ENCOUNTER — Other Ambulatory Visit: Payer: Self-pay

## 2020-03-11 ENCOUNTER — Ambulatory Visit (INDEPENDENT_AMBULATORY_CARE_PROVIDER_SITE_OTHER): Payer: Medicare Other | Admitting: Cardiovascular Disease

## 2020-03-11 ENCOUNTER — Encounter: Payer: Self-pay | Admitting: Cardiovascular Disease

## 2020-03-11 VITALS — BP 116/72 | HR 77 | Ht 70.0 in | Wt 218.2 lb

## 2020-03-11 DIAGNOSIS — G9001 Carotid sinus syncope: Secondary | ICD-10-CM

## 2020-03-11 DIAGNOSIS — Z01810 Encounter for preprocedural cardiovascular examination: Secondary | ICD-10-CM

## 2020-03-11 DIAGNOSIS — I480 Paroxysmal atrial fibrillation: Secondary | ICD-10-CM | POA: Diagnosis not present

## 2020-03-11 DIAGNOSIS — Z7901 Long term (current) use of anticoagulants: Secondary | ICD-10-CM

## 2020-03-11 DIAGNOSIS — E1169 Type 2 diabetes mellitus with other specified complication: Secondary | ICD-10-CM

## 2020-03-11 DIAGNOSIS — E782 Mixed hyperlipidemia: Secondary | ICD-10-CM | POA: Diagnosis not present

## 2020-03-11 DIAGNOSIS — I10 Essential (primary) hypertension: Secondary | ICD-10-CM

## 2020-03-11 DIAGNOSIS — Z950 Presence of cardiac pacemaker: Secondary | ICD-10-CM

## 2020-03-11 DIAGNOSIS — I2510 Atherosclerotic heart disease of native coronary artery without angina pectoris: Secondary | ICD-10-CM

## 2020-03-11 DIAGNOSIS — I472 Ventricular tachycardia: Secondary | ICD-10-CM

## 2020-03-11 DIAGNOSIS — E669 Obesity, unspecified: Secondary | ICD-10-CM

## 2020-03-11 DIAGNOSIS — I4729 Other ventricular tachycardia: Secondary | ICD-10-CM

## 2020-03-11 NOTE — Patient Instructions (Signed)
Medication Instructions:  Continue current medications  *If you need a refill on your cardiac medications before your next appointment, please call your pharmacy*   Lab Work: None ordered   Testing/Procedures: None ordered   Follow-Up: At [facility], you and your health needs are our priority.  As part of our continuing mission to provide you with exceptional heart care, we have created designated Provider Care Teams.  These Care Teams include your primary Cardiologist (physician) and Advanced Practice Providers (APPs -  Physician Assistants and Nurse Practitioners) who all work together to provide you with the care you need, when you need it.  We recommend signing up for the patient portal called "MyChart".  Sign up information is provided on this After Visit Summary.  MyChart is used to connect with patients for Virtual Visits (Telemedicine).  Patients are able to view lab/test results, encounter notes, upcoming appointments, etc.  Non-urgent messages can be sent to your provider as well.   To learn more about what you can do with MyChart, go to [email or web address].    Your next appointment:   1 year(s)  The format for your next appointment:   In Person  Provider:   You may see Sanda Klein, MD or one of the following Advanced Practice Providers on your designated Care Team:    Almyra Deforest, PA-C  Fabian Sharp, PA-C or   Roby Lofts, [place]

## 2020-03-11 NOTE — Progress Notes (Signed)
Cardiology office note   Date:  03/15/2020   ID:  Nathan Harding, DOB 06-Jun-1951, MRN [id number]  PCP:  Wynonia Hazard, MD  Cardiologist:  Annayah Worthley Electrophysiologist:  None   Evaluation Performed:  Follow-Up Visit  Chief Complaint:  Pacemaker, HLP, preoperative evaluation  History of Present Illness:    Nathan Harding is a 69 y.o. 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  Nathan Harding continues to feel great.  [place] in [place] and is able to perform fairly intense physical labor without complaints.  His biggest problem is bilateral knee pain.  Steroid injections only helped for about a week.  Hyaluronic acid injections have not been beneficial.  He is planning to undergo left total knee replacement in September (orthopedic surgeon Dr. Lorra Hals, [place] in [place]).  The patient specifically denies any chest pain at rest exertion, dyspnea at rest or with exertion, orthopnea, paroxysmal nocturnal dyspnea, syncope, palpitations, focal neurological deficits, intermittent claudication, lower extremity edema, unexplained weight gain, cough, hemoptysis or wheezing.  His most recent hemoglobin A1c has worsened, going up to 7.7% and he is committed to a new diet and is sure that he will see improvement.  Pacemaker check in the office today shows normal device function.  His device is an MRI conditional Medtronic Revo implanted in 2012.  Generator voltage is 2.90 V (RRT 2.81 V), atrial pacing occurs 2.3% of the time and he does not have ventricular pacing.  Lead parameters are normal and the heart rate  histograms are appropriate.  He has rare episodes of high atrial rates which appear consistent with sinus tachycardia.  No atrial fibrillation has been recorded.  The patient does not have symptoms concerning for COVID-19 infection (fever, chills, cough, or new shortness of breath).    Past Medical History:  Diagnosis Date  . Allergy   . Anxiety   . Arthritis   . Cancer (HCC)    skin- Basal Cell CA  . Cardiac pacemaker   . Carotid sinus syndrome with bradycardia   . Depression   . Diabetes mellitus   . ED (erectile dysfunction)   . GERD (gastroesophageal reflux disease)   . Hyperlipidemia   . Hypertension   . Male urinary stress incontinence   . Prostate cancer ([place]) 2013  . Right renal mass   . Severe sinus bradycardia   . Sleep apnea    uses CPAP  . Ulcer    Past Surgical History:  Procedure Laterality Date  . COLONOSCOPY    . PACEMAKER INSERTION  03/31/11   due to hypersensitive carotid sinus syndrome  . PENILE PROSTHESIS IMPLANT    . POLYPECTOMY    . PROSTATECTOMY    . ROTATOR CUFF REPAIR  2010   right  . SHOULDER SURGERY  2002   right  . temporal mastoidectomy  2009   behind left ear     Current Meds  Medication Sig  . acetaminophen (TYLENOL) 500 MG tablet Take 500 mg by mouth every 6 (six) hours as needed.  . carvedilol (COREG) 6.25 MG tablet Take 6.25 mg by mouth 2 (two) times daily with a meal.    . ELIQUIS 5 MG TABS tablet TAKE  1 TABLET BY MOUTH TWICE DAILY  . esomeprazole (NEXIUM) 40 MG capsule Take 40 mg by mouth daily before breakfast.  . famotidine (PEPCID) 40 MG tablet TK 1 T PO QD HS  . fenofibrate (TRICOR) 145 MG tablet TAKE 1 TABLET(145 MG) BY MOUTH DAILY. Please keep upcoming appointment for further refills.  . fexofenadine (ALLEGRA) 180 MG tablet Take 180 mg by mouth daily.    . fluticasone (FLONASE) 50 MCG/ACT nasal spray Place 2 sprays into the nose daily.    Marland Kitchen glimepiride (AMARYL) 1 MG tablet Take 2 mg by mouth daily before breakfast.  .  lisinopril (PRINIVIL,ZESTRIL) 20 MG tablet Take 20 mg by mouth daily.  . metFORMIN (GLUCOPHAGE-XR) 500 MG 24 hr tablet Take 2,000 mg by mouth daily.  Marland Kitchen neomycin-polymyxin-hydrocortisone (CORTISPORIN) otic solution 1-2 drops to the toe after soaking twice daily  . PARoxetine (PAXIL) 30 MG tablet Take 30 mg by mouth every morning.    . rosuvastatin (CRESTOR) 20 MG tablet Take 1 tablet (20 mg total) by mouth daily. NEED OV.  Marland Kitchen tetrahydrozoline-zinc (VISINE-AC) 0.05-0.25 % ophthalmic solution Place 2 drops into both eyes 3 (three) times daily as needed.  . [DISCONTINUED] omega-3 acid ethyl esters (LOVAZA) 1 g capsule take 2 capsules by mouth twice a day     Allergies:   Patient has no known allergies.   Social History   Tobacco Use  . Smoking status: Former Research scientist (life sciences)  . Smokeless tobacco: Never Used  Substance Use Topics  . Alcohol use: Yes    Alcohol/week: 6.0 standard drinks    Types: 6 Cans of beer per week    Comment: no drank in one month  -  updated 12/12/11   update 1-6 pack every 2 weeks  . Drug use: No     Family Hx: The patient's family history includes Cancer in his mother; Diabetes in his father; Esophageal cancer in his cousin; Hypertension in his brother and brother. There is no history of Colon cancer, Stomach cancer, or Rectal cancer.  ROS:   Please see the history of present illness.  All other systems are reviewed and are negative.  Prior CV studies:   The following studies were reviewed today:  Comprehensive pacemaker check in office  Labs/Other Tests and Data Reviewed:    EKG:  an electrocardiogram was ordered today shows normal sinus rhythm, normal tracing.  QTc 380 ms  Recent Labs: No results found for requested labs within last 8760 hours.   Recent Lipid Panel Lab Results  Component Value Date/Time   CHOL 158 02/16/2016 10:58 AM   TRIG 163 (H) 02/16/2016 10:58 AM   HDL 58 02/16/2016 10:58 AM   CHOLHDL 2.7 02/16/2016 10:58 AM   LDLCALC 67 02/16/2016  10:58 AM    Wt Readings from Last 3 Encounters:  03/11/20 218 lb 3.2 oz (99 kg)  02/13/19 210 lb (95.3 kg)  12/20/17 212 lb 6.4 oz (96.3 kg)     Objective:    Vital Signs:  BP 116/72   Pulse 77   Ht 5\' 10"  (1.778 m)   Wt 218 lb 3.2 oz (99 kg)   SpO2 98%   BMI 31.31 kg/m     General: Alert, oriented x3, no distress, mildly obese.  Healthy left subclavian pacemaker site. Head: no evidence of trauma, PERRL, EOMI, no exophtalmos or lid lag, no myxedema, no xanthelasma; normal ears, nose and oropharynx Neck: normal jugular venous pulsations and no hepatojugular reflux; brisk carotid pulses without delay and no carotid  bruits Chest: clear to auscultation, no signs of consolidation by percussion or palpation, normal fremitus, symmetrical and full respiratory excursions Cardiovascular: normal position and quality of the apical impulse, regular rhythm, normal first and second heart sounds, no murmurs, rubs or gallops Abdomen: no tenderness or distention, no masses by palpation, no abnormal pulsatility or arterial bruits, normal bowel sounds, no hepatosplenomegaly Extremities: no clubbing, cyanosis or edema; 2+ radial, ulnar and brachial pulses bilaterally; 2+ right femoral, posterior tibial and dorsalis pedis pulses; 2+ left femoral, posterior tibial and dorsalis pedis pulses; no subclavian or femoral bruits Neurological: grossly nonfocal Psych: Normal mood and affect   ASSESSMENT & PLAN:    1. AFib: His episodes are very infrequent.  CHADSVasc 3-4 (age, HTN, DM, +/- minor CAD).  Compliant with Eliquis. 2. Eliquis: Well-tolerated without bleeding problems.  Temporary interruption of the anticoagulant for surgery should be associated with low risk of embolic complications.  He should be restarted on Eliquis 5 mg twice daily after surgery which will help with DVT prophylaxis as well as stroke prevention. 3. HTN: Very well controlled. 4. HLP: Need to get a copy of his most recent lipid  profile.  Target LDL less than 100 due to diabetes mellitus. 5. DM: Currently poorly controlled.  He has committed himself to a stringent diet. 6. NSVT: Occasionally reported by his device, but none in the last several months.  Never symptomatic. 7. CAD: Minor nonobstructive LAD atherosclerosis seen at time of remote cardiac catheterization 2010.  He does not have angina pectoris. 8. Carotid sinus hypersensitivity: No syncope since pacemaker implantation. 9. Pacemaker: Normal device function.  Continue remote downloads every 3 months. 10. Preoperative cardiovascular examination: He has excellent functional status and he is at low risk for major cardiovascular complications with knee replacement surgery.  Eliquis can be discontinued 48 hours before the procedure and restarted as soon as is deemed safe by his surgeon.  He should be restarted on the 5 mg twice daily dose, rather than the DVT prophylaxis dose, since he has atrial fibrillation.  Carvedilol should not be interrupted at the time of surgery.  He is not pacemaker dependent and knee replacement surgery is unlikely to interfere with normal pacemaker function.  No particular precautions are needed for his pacemaker. COVID-19 Education: The signs and symptoms of COVID-19 were discussed with the patient and how to seek care for testing (follow up with PCP or arrange E-visit).  The importance of social distancing was discussed today.  Time:   Today, I have spent 17 minutes with the patient with telehealth technology discussing the above problems.     Medication Adjustments/Labs and Tests Ordered: Current medicines are reviewed at length with the patient today.  Concerns regarding medicines are outlined above.   Tests Ordered: Orders Placed This Encounter  Procedures  . EKG 12-Lead    Medication Changes: No orders of the defined types were placed in this encounter.   Disposition:  Follow up 12 months  Signed, Sanda Klein, MD    03/15/2020 10:59 AM    [facility]

## 2020-03-15 ENCOUNTER — Encounter: Payer: Self-pay | Admitting: Cardiovascular Disease

## 2020-03-15 DIAGNOSIS — Z7901 Long term (current) use of anticoagulants: Secondary | ICD-10-CM | POA: Insufficient documentation

## 2020-03-25 ENCOUNTER — Telehealth: Payer: Self-pay

## 2020-03-25 NOTE — Telephone Encounter (Signed)
Left message for patient to remind of missed remote transmission.  

## 2020-04-09 ENCOUNTER — Ambulatory Visit (INDEPENDENT_AMBULATORY_CARE_PROVIDER_SITE_OTHER): Payer: Medicare Other | Admitting: *Deleted

## 2020-04-09 DIAGNOSIS — I480 Paroxysmal atrial fibrillation: Secondary | ICD-10-CM

## 2020-04-11 LAB — CUP PACEART REMOTE DEVICE CHECK
Battery Voltage: 2.89 V
Brady Statistic AP VP Percent: 0 %
Brady Statistic AP VS Percent: 2.78 %
Brady Statistic AS VP Percent: 0 %
Brady Statistic AS VS Percent: 97.22 %
Brady Statistic RA Percent Paced: 2.78 %
Brady Statistic RV Percent Paced: 0 %
Date Time Interrogation Session: 20210707163150
Implantable Lead Implant Date: 20120628
Implantable Lead Implant Date: 20120628
Implantable Lead Location: 753859
Implantable Lead Location: 753860
Implantable Pulse Generator Implant Date: 20120628
Lead Channel Impedance Value: 424 Ohm
Lead Channel Impedance Value: 488 Ohm
Lead Channel Sensing Intrinsic Amplitude: 2.96 mV
Lead Channel Sensing Intrinsic Amplitude: 3.07 mV
Lead Channel Setting Pacing Amplitude: 2 V
Lead Channel Setting Pacing Amplitude: 2 V
Lead Channel Setting Pacing Pulse Width: 0.4 ms
Lead Channel Setting Sensing Sensitivity: 0.9 mV

## 2020-04-13 NOTE — Progress Notes (Signed)
Remote pacemaker transmission.   

## 2020-04-14 ENCOUNTER — Other Ambulatory Visit: Payer: Self-pay | Admitting: Cardiovascular Disease

## 2020-04-14 NOTE — Telephone Encounter (Signed)
*  STAT* If patient is at the pharmacy, call can be transferred to refill team.   1. Which medications need to be refilled? (please list name of each medication and dose if known) fenofibrate (TRICOR) 145 MG tablet  2. Which pharmacy/location (including street and city if local pharmacy) is medication to be sent to? [facility] [place]  3. Do they need a 30 day or 90 day supply? 90 day supply  Patient is completely out of medication.

## 2020-04-15 ENCOUNTER — Other Ambulatory Visit: Payer: Self-pay

## 2020-04-15 MED ORDER — FENOFIBRATE 145 MG PO TABS: ORAL_TABLET | ORAL | 3 refills | Status: DC

## 2020-04-15 NOTE — Telephone Encounter (Signed)
Refill sent to requested pharmacy.

## 2020-04-20 ENCOUNTER — Other Ambulatory Visit: Payer: Self-pay | Admitting: Cardiovascular Disease

## 2020-07-09 ENCOUNTER — Ambulatory Visit (INDEPENDENT_AMBULATORY_CARE_PROVIDER_SITE_OTHER): Payer: Medicare Other

## 2020-07-09 DIAGNOSIS — R001 Bradycardia, unspecified: Secondary | ICD-10-CM

## 2020-07-11 LAB — CUP PACEART REMOTE DEVICE CHECK
Battery Voltage: 2.89 V
Brady Statistic AP VP Percent: 0 %
Brady Statistic AP VS Percent: 5.3 %
Brady Statistic AS VP Percent: 0 %
Brady Statistic AS VS Percent: 94.7 %
Brady Statistic RA Percent Paced: 5.3 %
Brady Statistic RV Percent Paced: 0 %
Date Time Interrogation Session: 20211008170546
Implantable Lead Implant Date: 20120628
Implantable Lead Implant Date: 20120628
Implantable Lead Location: 753859
Implantable Lead Location: 753860
Implantable Pulse Generator Implant Date: 20120628
Lead Channel Impedance Value: 432 Ohm
Lead Channel Impedance Value: 504 Ohm
Lead Channel Sensing Intrinsic Amplitude: 2.729 mV
Lead Channel Sensing Intrinsic Amplitude: 4.02 mV
Lead Channel Setting Pacing Amplitude: 2 V
Lead Channel Setting Pacing Amplitude: 2 V
Lead Channel Setting Pacing Pulse Width: 0.4 ms
Lead Channel Setting Sensing Sensitivity: 0.9 mV

## 2020-07-13 NOTE — Progress Notes (Signed)
Remote pacemaker transmission.   

## 2020-07-15 ENCOUNTER — Other Ambulatory Visit: Payer: Self-pay | Admitting: Cardiovascular Disease

## 2020-07-15 MED ORDER — APIXABAN 5 MG PO TABS: 5.0000 mg | ORAL_TABLET | Freq: Two times a day (BID) | ORAL | 1 refills | Status: DC

## 2020-07-15 NOTE — Telephone Encounter (Signed)
Rx request sent to pharmacy.  

## 2020-07-15 NOTE — Telephone Encounter (Signed)
   *  STAT* If patient is at the pharmacy, call can be transferred to refill team.   1. Which medications need to be refilled? (please list name of each medication and dose if known) ELIQUIS 5 MG TABS tablet  2. Which pharmacy/location (including street and city if local pharmacy) is medication to be sent to? [facility] [place]  3. Do they need a 30 day or 90 day supply? 90 days

## 2020-10-08 ENCOUNTER — Ambulatory Visit (INDEPENDENT_AMBULATORY_CARE_PROVIDER_SITE_OTHER): Payer: Medicare Other

## 2020-10-08 DIAGNOSIS — R001 Bradycardia, unspecified: Secondary | ICD-10-CM | POA: Diagnosis not present

## 2020-10-09 LAB — CUP PACEART REMOTE DEVICE CHECK
Battery Voltage: 2.88 V
Brady Statistic AP VP Percent: 0 %
Brady Statistic AP VS Percent: 6.63 %
Brady Statistic AS VP Percent: 0 %
Brady Statistic AS VS Percent: 93.36 %
Brady Statistic RA Percent Paced: 6.63 %
Brady Statistic RV Percent Paced: 0 %
Date Time Interrogation Session: 20220106193837
Implantable Lead Implant Date: 20120628
Implantable Lead Implant Date: 20120628
Implantable Lead Location: 753859
Implantable Lead Location: 753860
Implantable Pulse Generator Implant Date: 20120628
Lead Channel Impedance Value: 448 Ohm
Lead Channel Impedance Value: 504 Ohm
Lead Channel Sensing Intrinsic Amplitude: 2.729 mV
Lead Channel Sensing Intrinsic Amplitude: 3.137 mV
Lead Channel Setting Pacing Amplitude: 2 V
Lead Channel Setting Pacing Amplitude: 2 V
Lead Channel Setting Pacing Pulse Width: 0.4 ms
Lead Channel Setting Sensing Sensitivity: 0.9 mV

## 2020-10-15 ENCOUNTER — Other Ambulatory Visit: Payer: Self-pay | Admitting: Cardiovascular Disease

## 2020-10-22 NOTE — Progress Notes (Signed)
Remote pacemaker transmission.   

## 2021-01-07 ENCOUNTER — Ambulatory Visit (INDEPENDENT_AMBULATORY_CARE_PROVIDER_SITE_OTHER): Payer: Medicare Other

## 2021-01-07 DIAGNOSIS — I480 Paroxysmal atrial fibrillation: Secondary | ICD-10-CM

## 2021-01-12 LAB — CUP PACEART REMOTE DEVICE CHECK
Battery Voltage: 2.86 V
Brady Statistic AP VP Percent: 0 %
Brady Statistic AP VS Percent: 5.42 %
Brady Statistic AS VP Percent: 0 %
Brady Statistic AS VS Percent: 94.58 %
Brady Statistic RA Percent Paced: 5.42 %
Brady Statistic RV Percent Paced: 0 %
Date Time Interrogation Session: 20220408132034
Implantable Lead Implant Date: 20120628
Implantable Lead Implant Date: 20120628
Implantable Lead Location: 753859
Implantable Lead Location: 753860
Implantable Pulse Generator Implant Date: 20120628
Lead Channel Impedance Value: 424 Ohm
Lead Channel Impedance Value: 488 Ohm
Lead Channel Sensing Intrinsic Amplitude: 2.388 mV
Lead Channel Sensing Intrinsic Amplitude: 3.313 mV
Lead Channel Setting Pacing Amplitude: 2 V
Lead Channel Setting Pacing Amplitude: 2 V
Lead Channel Setting Pacing Pulse Width: 0.4 ms
Lead Channel Setting Sensing Sensitivity: 0.9 mV

## 2021-01-20 NOTE — Progress Notes (Signed)
Remote pacemaker transmission.   

## 2021-04-23 ENCOUNTER — Telehealth: Payer: Self-pay | Admitting: Cardiovascular Disease

## 2021-04-23 MED ORDER — FENOFIBRATE 145 MG PO TABS: ORAL_TABLET | ORAL | 3 refills | Status: DC

## 2021-04-23 NOTE — Telephone Encounter (Signed)
*  STAT* If patient is at the pharmacy, call can be transferred to refill team.   1. Which medications need to be refilled? (please list name of each medication and dose if known) Fenofibrate  2. Which pharmacy/location (including street and city if local pharmacy) is medication to be sent to? [facility] [place]  3. Do they need a 30 day or 90 day supply? 90 days and refills- patient need this today please, he have been out for a week

## 2021-04-23 NOTE — Telephone Encounter (Signed)
Notified patient medication sent to pharmacy 

## 2021-07-05 ENCOUNTER — Telehealth: Payer: Self-pay | Admitting: Cardiovascular Disease

## 2021-07-05 NOTE — Telephone Encounter (Signed)
 *  STAT* If patient is at the pharmacy, call can be transferred to refill team.   1. Which medications need to be refilled? (please list name of each medication and dose if known)   apixaban (ELIQUIS) 5 MG TABS tablet    2. Which pharmacy/location (including street and city if local pharmacy) is medication to be sent to? [facility] [place]  3. Do they need a 30 day or 90 day supply? 90 days  Pt needs refill tomorrow, he only have 1 pill left

## 2021-07-06 MED ORDER — APIXABAN 5 MG PO TABS: 5.0000 mg | ORAL_TABLET | Freq: Two times a day (BID) | ORAL | 0 refills | Status: DC

## 2021-07-06 NOTE — Telephone Encounter (Signed)
Forward to anticoag  pool for review and refill

## 2021-07-06 NOTE — Telephone Encounter (Signed)
Prescription refill request for Eliquis received. Indication: afib  Last office visit: [place], 03/11/2020 Scr: 1.38, 12/03/2018 Age: 70 yo  Weight:  99 kg  Pt overdue for office visit but is scheduled to see Caron Presume on 12/8 . Put on appointment note for pt to get blood work at that visit. Spoke with Rutha Bouchard D, will refill until pt can get to appointment.

## 2021-07-08 ENCOUNTER — Ambulatory Visit (INDEPENDENT_AMBULATORY_CARE_PROVIDER_SITE_OTHER): Payer: Medicare Other

## 2021-07-08 DIAGNOSIS — I480 Paroxysmal atrial fibrillation: Secondary | ICD-10-CM | POA: Diagnosis not present

## 2021-07-09 LAB — CUP PACEART REMOTE DEVICE CHECK
Battery Voltage: 2.85 V
Brady Statistic AP VP Percent: 0 %
Brady Statistic AP VS Percent: 4.58 %
Brady Statistic AS VP Percent: 0 %
Brady Statistic AS VS Percent: 95.41 %
Brady Statistic RA Percent Paced: 4.58 %
Brady Statistic RV Percent Paced: 0 %
Date Time Interrogation Session: 20221006120533
Implantable Lead Implant Date: 20120628
Implantable Lead Implant Date: 20120628
Implantable Lead Location: 753859
Implantable Lead Location: 753860
Implantable Pulse Generator Implant Date: 20120628
Lead Channel Impedance Value: 424 Ohm
Lead Channel Impedance Value: 480 Ohm
Lead Channel Sensing Intrinsic Amplitude: 2.518 mV
Lead Channel Sensing Intrinsic Amplitude: 3.07 mV
Lead Channel Setting Pacing Amplitude: 2 V
Lead Channel Setting Pacing Amplitude: 2 V
Lead Channel Setting Pacing Pulse Width: 0.4 ms
Lead Channel Setting Sensing Sensitivity: 0.9 mV

## 2021-07-17 ENCOUNTER — Other Ambulatory Visit: Payer: Self-pay | Admitting: Cardiovascular Disease

## 2021-07-19 NOTE — Progress Notes (Signed)
Remote pacemaker transmission.   

## 2021-07-22 ENCOUNTER — Telehealth: Payer: Self-pay | Admitting: *Deleted

## 2021-07-22 NOTE — Telephone Encounter (Signed)
   [facility] Pre-operative Risk Assessment    Patient Name: DAYMOND CORDTS  DOB: [birth year] MRN: [id number]  HEARTCARE STAFF:  - IMPORTANT!!!!!! Under Visit Info/Reason for Call, type in Other and utilize the format Clearance MM/DD/YY or Clearance TBD. Do not use dashes or single digits. - Please review there is not already an duplicate clearance open for this procedure. - If request is for dental extraction, please clarify the # of teeth to be extracted. - If the patient is currently at the dentist's office, call Pre-Op Callback Staff (MA/nurse) to input urgent request.  - If the patient is not currently in the dentist office, please route to the Pre-Op pool.  Request for surgical clearance:  What type of surgery is being performed? LEFT TOTAL KNEE ARTHROPLASTY  When is this surgery scheduled? TBD  What type of clearance is required (medical clearance vs. Pharmacy clearance to hold med vs. Both)? BOTH  Are there any medications that need to be held prior to surgery and how long? [facility] [employer]  Practice name and name of physician performing surgery? [place] is the office phone number? [phone]   5.   What is the office fax number? [phone]  8.   Anesthesia type (None, local, MAC, general) ? CHOICE   Devra Dopp 07/22/2021, 4:22 PM  _________________________________________________________________   (provider comments below)

## 2021-07-26 NOTE — Telephone Encounter (Signed)
Patient with diagnosis of afib on Eliquis for anticoagulation.    Procedure: left TKA Date of procedure: TBD  CHA2DS2-VASc Score = 4  This indicates a 4.8% annual risk of stroke. The patient's score is based upon: CHF History: 0 HTN History: 1 Diabetes History: 1 Stroke History: 0 Vascular Disease History: 1 Age Score: 1 Gender Score: 0   Pt hasn't had labs checked in over 2 years. Requires updated CBC and BMET. If labs are stable, patient can hold Eliquis for 3 days prior to procedure per office protocol.

## 2021-07-26 NOTE — Telephone Encounter (Addendum)
   Name: Nathan Harding  DOB: 1951-07-28  MRN: [id number]  Primary Cardiologist: Sanda Klein, MD  Chart reviewed as part of pre-operative protocol coverage.   Patient was last seen by Dr. Sallyanne Kuster in 03/2020.  Because of Nathan Harding's past medical history and time since last visit, he will require a follow-up visit in order to better assess preoperative cardiovascular risk.  Pre-op covering staff: - Please schedule appointment and call patient to inform them. If patient already had an upcoming appointment within acceptable timeframe, please add "pre-op clearance" to the appointment notes so provider is aware. - Pt will need CBC, BMET at time of appt.  - Please contact requesting surgeon's office via preferred method (i.e, phone, fax) to inform them of need for appointment prior to surgery.   Richardson Dopp, PA-C  07/26/2021, 3:48 PM

## 2021-07-27 NOTE — Telephone Encounter (Signed)
Pt has been scheduled to see Caron Presume, PA-C, 07/29/21.  Surgical clearance will be addressed at that visit as well as BMET & CBC.  Will route back to the requesting surgeon's office back to make them aware.

## 2021-07-27 NOTE — Telephone Encounter (Signed)
Do not see this got routed to callback; will route - see Scott's msg below.

## 2021-07-28 NOTE — Progress Notes (Addendum)
Cardiology Office Note:    Date:  07/29/2021   ID:  Nathan Harding, DOB Oct 09, 1950, MRN [id number]  PCP:  Nathan Hazard, MD Nathan Harding Cardiologist: Nathan Klein, MD   Reason for visit: Surgical clearance for left knee replacement [facility] DR Nathan Harding fax number: [phone]  History of Present Illness:    Nathan Harding is a 70 y.o. 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  Last saw Dr. Sallyanne Harding in June 2021 and was doing well.  Today, he states with bone spurs in the left knee, he is now in need for left knee replacement.  He has osteoarthritis in both knees.  From a cardiovascular standpoint, he denies chest pain, shortness of breath, palpitations, lightheadedness, syncope, PND, orthopnea and lower extremity edema.  He states he has had recent blood work with his PCP, Dr. Arvil Harding.  Nathan Harding manages his lipids.  Patient is from [place].      Past Medical History:  Diagnosis Date   Allergy    Anxiety    Arthritis    Cancer ([place])    skin- Basal Cell CA   Cardiac pacemaker    Carotid sinus syndrome with bradycardia    Depression    Diabetes mellitus    ED (erectile dysfunction)    GERD (gastroesophageal reflux disease)    Hyperlipidemia    Hypertension    Male urinary stress incontinence    Prostate cancer ([place]) 2013   Right renal mass    Severe sinus bradycardia    Sleep apnea    uses CPAP   Ulcer     Past Surgical History:  Procedure Laterality Date   COLONOSCOPY     PACEMAKER INSERTION  03/31/11   due to hypersensitive carotid sinus syndrome   PENILE PROSTHESIS IMPLANT     POLYPECTOMY     PROSTATECTOMY     ROTATOR  CUFF REPAIR  2010   right   SHOULDER SURGERY  2002   right   temporal mastoidectomy  2009   behind left ear    Current Medications: Current Meds  Medication Sig   acetaminophen (TYLENOL) 500 MG tablet Take 500 mg by mouth every 6 (six) hours as needed.   apixaban (ELIQUIS) 5 MG TABS tablet Take 1 tablet (5 mg total) by mouth 2 (two) times daily.   carvedilol (COREG) 6.25 MG tablet Take 6.25 mg by mouth 2 (two) times daily with a meal.     famotidine (PEPCID) 40 MG tablet TK 1 T PO QD HS   fenofibrate (TRICOR) 145 MG tablet TAKE 1 TABLET BY MOUTH EACH DAY. PLEASE KEEP UPCOMING APPOINTMENT FOR FURTHER REFILLS   fexofenadine (ALLEGRA) 180 MG tablet Take 180 mg by mouth daily.     fluticasone (FLONASE) 50 MCG/ACT nasal spray Place 2 sprays into the nose daily.     glimepiride (AMARYL) 1 MG tablet Take 2 mg by mouth daily before breakfast.   lisinopril (PRINIVIL,ZESTRIL) 20 MG tablet Take 20 mg by mouth daily.   metFORMIN (GLUCOPHAGE-XR) 500 MG 24 hr tablet Take 2,000 mg by mouth daily.   PARoxetine (PAXIL) 30 MG tablet Take 30 mg by mouth every morning.     rosuvastatin (CRESTOR) 20 MG tablet Take 1 tablet (20 mg total) by mouth  daily. NEED OV.   tetrahydrozoline-zinc (VISINE-AC) 0.05-0.25 % ophthalmic solution Place 2 drops into both eyes 3 (three) times daily as needed.     Allergies:   Patient has no known allergies.   Social History   Socioeconomic History   Marital status: Married    Spouse name: Not on file   Number of children: Not on file   Years of education: Not on file   Highest education level: Not on file  Occupational History   Not on file  Tobacco Use   Smoking status: Former   Smokeless tobacco: Never  Substance and Sexual Activity   Alcohol use: Yes    Alcohol/week: 6.0 standard drinks    Types: 6 Cans of beer per week    Comment: no drank in one month  -  updated 12/12/11   update 1-6 pack every 2 weeks   Drug use: No   Sexual activity: Not on file  Other  Topics Concern   Not on file  Social History Narrative   Not on file   Social Determinants of Health   Financial Resource Strain: Not on file  Food Insecurity: Not on file  Transportation Needs: Not on file  Physical Activity: Not on file  Stress: Not on file  Social Connections: Not on file     Family History: The patient's family history includes Cancer in his mother; Diabetes in his father; Esophageal cancer in his cousin; Hypertension in his brother and brother. There is no history of Colon cancer, Stomach cancer, or Rectal cancer.  ROS:   Please see the history of present illness.     EKGs/Labs/Other Studies Reviewed:    EKG:  The ekg ordered today demonstrates normal sinus rhythm, heart rate 78, QRS duration 92 ms, PR interval 200 ms.  Recent Labs: No results found for requested labs within last 8760 hours.   Recent Lipid Panel Lab Results  Component Value Date/Time   CHOL 158 02/16/2016 10:58 AM   TRIG 163 (H) 02/16/2016 10:58 AM   HDL 58 02/16/2016 10:58 AM   LDLCALC 67 02/16/2016 10:58 AM    Physical Exam:    VS:  BP 122/72   Pulse 78   Ht 5\' 10"  (1.778 m)   Wt 215 lb 9.6 oz (97.8 kg)   SpO2 98%   BMI 30.94 kg/m    No data found.  Wt Readings from Last 3 Encounters:  07/29/21 215 lb 9.6 oz (97.8 kg)  03/11/20 218 lb 3.2 oz (99 kg)  02/13/19 210 lb (95.3 kg)     GEN:  Well nourished, well developed in no acute distress HEENT: Normal NECK: No JVD; No carotid bruits CARDIAC: RRR, no murmurs, rubs, gallops RESPIRATORY:  Clear to auscultation without rales, wheezing or rhonchi  ABDOMEN: Soft, non-tender, non-distended MUSCULOSKELETAL: No edema; No deformity  SKIN: Warm and dry NEUROLOGIC:  Alert and oriented PSYCHIATRIC:  Normal affect     ASSESSMENT AND PLAN   Preop clearance for left knee replacement Click Here to Calculate RCRI      :[id number]}   Nathan Harding perioperative risk of a major cardiac event is 0.4% according to the Revised  Cardiac Risk Index (RCRI).  Therefore, he is at low risk for perioperative complications.   His functional capacity is good at 4.61 METs according to the Duke Activity Status Index (DASI). Recommendations: According to ACC/AHA guidelines, no further cardiovascular testing needed.  The patient may proceed to surgery at acceptable risk.   Antiplatelet and/or Anticoagulation  Recommendations:  Eliquis (Apixaban) can be held for 3 days prior to surgery.  Please resume post op when felt to be safe.    Paroxysmal atrial fibrillation -Infrequent episodes -Continue Eliquis for stroke prevention  CAD, no angina -Minor nonobstructive LAD atherosclerosis seen at time of remote cardiac catheterization 2010. -Continue beta-blocker and statin therapy.  Not on aspirin given need for anticoagulation.  Hypertension, well controlled -Continue current medications.  Creatinine 1.19 and potassium 4.4 on labs September 2022. -Goal BP is <130/80.  Recommend DASH diet (high in vegetables, fruits, low-fat dairy products, whole grains, poultry, fish, and nuts and low in sweets, sugar-sweetened beverages, and red meats), salt restriction and increase physical activity.  Hyperlipidemia, managed by PCP -LDL 69 on September 2022.  Total cholesterol 137, triglycerides 95 and HDL 50.  On Crestor and Tricor - continue. -Discussed cholesterol lowering diets - Mediterranean diet, DASH diet, vegetarian diet, low-carbohydrate diet and avoidance of trans fats.  Discussed healthier choice substitutes.  Nuts, high-fiber foods, and fiber supplements may also improve lipids.    Carotid sinus hypersensitivity - No syncope since pacemaker implantation.  Interrogation July 09, 2021 showed patient is not pacemaker dependent.  No clinically significant episodes of high ventricular rate or atrial mode switch.  Disposition - Follow-up in 1 year with Dr. Sallyanne Harding.        Medication Adjustments/Labs and Tests Ordered: Current  medicines are reviewed at length with the patient today.  Concerns regarding medicines are outlined above.  Orders Placed This Encounter  Procedures   EKG 12-Lead   No orders of the defined types were placed in this encounter.   Patient Instructions  Medication Instructions:  No Changes *If you need a refill on your cardiac medications before your next appointment, please call your pharmacy*   Lab Work: No Labs If you have labs (blood work) drawn today and your tests are completely normal, you will receive your results only by: [place] (if you have MyChart) OR A paper copy in the mail If you have any lab test that is abnormal or we need to change your treatment, we will call you to review the results.   Testing/Procedures: No Testing   Follow-Up: At [facility], you and your health needs are our priority.  As part of our continuing mission to provide you with exceptional heart care, we have created designated Provider Care Teams.  These Care Teams include your primary Cardiologist (physician) and Advanced Practice Providers (APPs -  Physician Assistants and Nurse Practitioners) who all work together to provide you with the care you need, when you need it.  We recommend signing up for the patient portal called "MyChart".  Sign up information is provided on this After Visit Summary.  MyChart is used to connect with patients for Virtual Visits (Telemedicine).  Patients are able to view lab/test results, encounter notes, upcoming appointments, etc.  Non-urgent messages can be sent to your provider as well.   To learn more about what you can do with MyChart, go to [email or web address].    Your next appointment:   1 year(s)  The format for your next appointment:   In Person  Provider:   Sanda Klein, MD   Other Instructions Hold Eliquis 3 Day prior to Surgery. Please have primary Care Provider Fax Blood Test Results to [phone].    Signed, Warren Lacy, PA-C  07/29/2021 11:41 AM    [facility]

## 2021-07-29 ENCOUNTER — Other Ambulatory Visit: Payer: Self-pay

## 2021-07-29 ENCOUNTER — Encounter: Payer: Self-pay | Admitting: Physician Assistant

## 2021-07-29 ENCOUNTER — Ambulatory Visit: Payer: Medicare Other | Admitting: Physician Assistant

## 2021-07-29 VITALS — BP 122/72 | HR 78 | Ht 70.0 in | Wt 215.6 lb

## 2021-07-29 DIAGNOSIS — E782 Mixed hyperlipidemia: Secondary | ICD-10-CM

## 2021-07-29 DIAGNOSIS — Z01810 Encounter for preprocedural cardiovascular examination: Secondary | ICD-10-CM

## 2021-07-29 DIAGNOSIS — I10 Essential (primary) hypertension: Secondary | ICD-10-CM | POA: Diagnosis not present

## 2021-07-29 DIAGNOSIS — I480 Paroxysmal atrial fibrillation: Secondary | ICD-10-CM

## 2021-07-29 DIAGNOSIS — Z7901 Long term (current) use of anticoagulants: Secondary | ICD-10-CM

## 2021-07-29 NOTE — Patient Instructions (Signed)
Medication Instructions:  No Changes *If you need a refill on your cardiac medications before your next appointment, please call your pharmacy*   Lab Work: No Labs If you have labs (blood work) drawn today and your tests are completely normal, you will receive your results only by: [place] (if you have MyChart) OR A paper copy in the mail If you have any lab test that is abnormal or we need to change your treatment, we will call you to review the results.   Testing/Procedures: No Testing   Follow-Up: At [facility], you and your health needs are our priority.  As part of our continuing mission to provide you with exceptional heart care, we have created designated Provider Care Teams.  These Care Teams include your primary Cardiologist (physician) and Advanced Practice Providers (APPs -  Physician Assistants and Nurse Practitioners) who all work together to provide you with the care you need, when you need it.  We recommend signing up for the patient portal called "MyChart".  Sign up information is provided on this After Visit Summary.  MyChart is used to connect with patients for Virtual Visits (Telemedicine).  Patients are able to view lab/test results, encounter notes, upcoming appointments, etc.  Non-urgent messages can be sent to your provider as well.   To learn more about what you can do with MyChart, go to [email or web address].    Your next appointment:   1 year(s)  The format for your next appointment:   In Person  Provider:   Sanda Klein, MD   Other Instructions Hold Eliquis 3 Day prior to Surgery. Please have primary Care Provider Fax Blood Test Results to [phone].

## 2021-08-02 NOTE — Telephone Encounter (Signed)
Pt had preop appt 10/27, BMET and CBC not drawn. Note states "he has had recent blood work with his PCP, Dr. Arvil Persons." These labs do not show up in [facility], please call to PCP office to have them fax labs to our office.

## 2021-08-02 NOTE — Telephone Encounter (Signed)
I s/w PCP office and asked if we may have recent BMET, CBC faxed to our office [phone] in order for pre op clearance. Labs to be faxed over today to [phone] ATTN: Drexel Ivey/PRE OP TEAM. Once labs have been received I will have HIM Dept scan them in for pre op provider and pre op pharm-d to review.

## 2021-08-02 NOTE — Telephone Encounter (Signed)
Our office received lab results from the PCP office. I will have HIM dept scan into the pt's chart for pre op provider to assess.   Hgb: 14.3 HCT: 44.2 RBC: 4.67 WBC: 5.8 Plt : 221  eGFR: 66 AST: 16 ALT: 18 ALP: 54 Glucose: 177 Protein, Total, Serum: 7.0 Co2: 18 K+: 4.4 Na: 138 BUN: 13 Creatinine: 1.19 Bilirubin, Total: 0.3

## 2021-08-09 NOTE — Telephone Encounter (Signed)
BMET and CBC stable, ok to hold Eliquis for 3 days prior to procedure.

## 2021-08-09 NOTE — Telephone Encounter (Signed)
    Patient Name: Nathan Harding  DOB: 07-Apr-1951 MRN: [id number]  Primary Cardiologist: Sanda Klein, MD  Chart reviewed as part of pre-operative protocol coverage. Given past medical history and time since last visit, based on ACC/AHA guidelines, [name] would be at acceptable risk for the planned procedure without further cardiovascular testing.   Based on the recent office note:  Preop clearance for left knee replacement6}   [name] perioperative risk of a major cardiac event is 0.4% according to the Revised Cardiac Risk Index (RCRI).  Therefore, he is at low risk for perioperative complications.   His functional capacity is good at 4.61 METs according to the Duke Activity Status Index (DASI). Recommendations: According to ACC/AHA guidelines, no further cardiovascular testing needed.  The patient may proceed to surgery at acceptable risk.   Antiplatelet and/or Anticoagulation Recommendations:   Eliquis (Apixaban) can be held for 3 days prior to surgery.  Please resume post op when felt to be safe.     I will route this recommendation to the requesting party via Epic fax function and remove from pre-op pool.  Please call with questions.  Almyra Deforest, [place] 08/09/2021, 9:40 AM

## 2021-09-09 ENCOUNTER — Ambulatory Visit: Payer: Medicare Other | Admitting: Physician Assistant

## 2021-09-21 ENCOUNTER — Other Ambulatory Visit: Payer: Self-pay | Admitting: Cardiovascular Disease

## 2021-09-21 NOTE — Telephone Encounter (Signed)
Prescription refill request for Eliquis received. Indication: Afib  Last office visit:07/29/21 Quentin Ore)  Scr: 1.19 (06/19/21 Age: 70 Weight: 97.8kg  Appropriate dose and refill sent to requested pharmacy.

## 2021-10-07 ENCOUNTER — Ambulatory Visit (INDEPENDENT_AMBULATORY_CARE_PROVIDER_SITE_OTHER): Payer: Medicare Other

## 2021-10-07 DIAGNOSIS — I480 Paroxysmal atrial fibrillation: Secondary | ICD-10-CM

## 2021-10-07 LAB — CUP PACEART REMOTE DEVICE CHECK
Battery Voltage: 2.83 V
Brady Statistic AP VP Percent: 0 %
Brady Statistic AP VS Percent: 1.24 %
Brady Statistic AS VP Percent: 0 %
Brady Statistic AS VS Percent: 98.76 %
Brady Statistic RA Percent Paced: 1.24 %
Brady Statistic RV Percent Paced: 0 %
Date Time Interrogation Session: 20230105125956
Implantable Lead Implant Date: 20120628
Implantable Lead Implant Date: 20120628
Implantable Lead Location: 753859
Implantable Lead Location: 753860
Implantable Pulse Generator Implant Date: 20120628
Lead Channel Impedance Value: 432 Ohm
Lead Channel Impedance Value: 456 Ohm
Lead Channel Sensing Intrinsic Amplitude: 3.752 mV
Lead Channel Sensing Intrinsic Amplitude: 4.285 mV
Lead Channel Setting Pacing Amplitude: 2 V
Lead Channel Setting Pacing Amplitude: 2 V
Lead Channel Setting Pacing Pulse Width: 0.4 ms
Lead Channel Setting Sensing Sensitivity: 0.9 mV

## 2021-10-08 ENCOUNTER — Telehealth: Payer: Self-pay

## 2021-10-08 NOTE — Telephone Encounter (Signed)
Scheduled remote reviewed. Normal device function.   Battery voltage 2.83V, RRT 2.81V Route to triage Next remote to be determined LA  Successful telephone encounter to patient to discuss battery voltage and need for monthly remote monitoring for battery check. Patient is provided schedule beginning 11/08/21 as he is a manual send. Patient appreciative of call. Continue to monitor.

## 2021-10-14 ENCOUNTER — Other Ambulatory Visit: Payer: Self-pay | Admitting: Cardiovascular Disease

## 2021-10-18 NOTE — Progress Notes (Signed)
Remote pacemaker transmission.   

## 2021-11-08 ENCOUNTER — Ambulatory Visit (INDEPENDENT_AMBULATORY_CARE_PROVIDER_SITE_OTHER): Payer: Medicare Other

## 2021-11-08 DIAGNOSIS — I480 Paroxysmal atrial fibrillation: Secondary | ICD-10-CM

## 2021-11-09 LAB — CUP PACEART REMOTE DEVICE CHECK
Battery Voltage: 2.82 V
Brady Statistic AP VP Percent: 0 %
Brady Statistic AP VS Percent: 1.61 %
Brady Statistic AS VP Percent: 0 %
Brady Statistic AS VS Percent: 98.39 %
Brady Statistic RA Percent Paced: 1.61 %
Brady Statistic RV Percent Paced: 0 %
Date Time Interrogation Session: 20230206132938
Implantable Lead Implant Date: 20120628
Implantable Lead Implant Date: 20120628
Implantable Lead Location: 753859
Implantable Lead Location: 753860
Implantable Pulse Generator Implant Date: 20120628
Lead Channel Impedance Value: 432 Ohm
Lead Channel Impedance Value: 472 Ohm
Lead Channel Sensing Intrinsic Amplitude: 3.07 mV
Lead Channel Sensing Intrinsic Amplitude: 3.137 mV
Lead Channel Setting Pacing Amplitude: 2 V
Lead Channel Setting Pacing Amplitude: 2 V
Lead Channel Setting Pacing Pulse Width: 0.4 ms
Lead Channel Setting Sensing Sensitivity: 0.9 mV

## 2021-11-11 NOTE — Progress Notes (Signed)
Remote pacemaker transmission.   

## 2021-11-11 NOTE — Addendum Note (Signed)
Addended by: Cheri Kearns A on: 11/11/2021 11:14 AM   Modules accepted: Level of Service

## 2021-12-09 ENCOUNTER — Ambulatory Visit (INDEPENDENT_AMBULATORY_CARE_PROVIDER_SITE_OTHER): Payer: Medicare Other

## 2021-12-09 DIAGNOSIS — I480 Paroxysmal atrial fibrillation: Secondary | ICD-10-CM

## 2021-12-10 LAB — CUP PACEART REMOTE DEVICE CHECK
Battery Voltage: 2.82 V
Brady Statistic AP VP Percent: 0 %
Brady Statistic AP VS Percent: 1.19 %
Brady Statistic AS VP Percent: 0 %
Brady Statistic AS VS Percent: 98.81 %
Brady Statistic RA Percent Paced: 1.19 %
Brady Statistic RV Percent Paced: 0 %
Date Time Interrogation Session: 20230310180422
Implantable Lead Implant Date: 20120628
Implantable Lead Implant Date: 20120628
Implantable Lead Location: 753859
Implantable Lead Location: 753860
Implantable Pulse Generator Implant Date: 20120628
Lead Channel Impedance Value: 440 Ohm
Lead Channel Impedance Value: 464 Ohm
Lead Channel Sensing Intrinsic Amplitude: 2.695 mV
Lead Channel Sensing Intrinsic Amplitude: 3.411 mV
Lead Channel Setting Pacing Amplitude: 2 V
Lead Channel Setting Pacing Amplitude: 2 V
Lead Channel Setting Pacing Pulse Width: 0.4 ms
Lead Channel Setting Sensing Sensitivity: 0.9 mV

## 2021-12-20 ENCOUNTER — Other Ambulatory Visit: Payer: Self-pay | Admitting: Cardiovascular Disease

## 2021-12-20 NOTE — Telephone Encounter (Signed)
Prescription refill request for Eliquis received. ?Indication:Afib ?Last office visit:10/22 ?Scr:1.1 ?Age: 71 ?Weight:97.8 kg ? ?Prescription refilled ? ?

## 2021-12-22 NOTE — Addendum Note (Signed)
Addended by: Douglass Rivers D on: 12/22/2021 12:04 PM ? ? Modules accepted: Level of Service ? ?

## 2021-12-22 NOTE — Progress Notes (Signed)
Remote pacemaker transmission.   

## 2021-12-23 ENCOUNTER — Telehealth: Payer: Self-pay | Admitting: Cardiovascular Disease

## 2021-12-23 NOTE — Telephone Encounter (Signed)
Pt has been made aware that it is ok for him to hold his Eliquis, starting tomorrow, for his upcoming procedure. ? ?Pt was thankful for the call back.  ?

## 2021-12-23 NOTE — Telephone Encounter (Signed)
I will forward this to pre op and pre op pharm-d for further advise.  ?

## 2021-12-23 NOTE — Telephone Encounter (Signed)
Yes, patient ok to hold for 3 days starting tomorrow ?

## 2021-12-23 NOTE — Telephone Encounter (Signed)
Pt c/o medication issue: ? ?1. Name of Medication: apixaban (ELIQUIS) 5 MG TABS tablet ? ?2. How are you currently taking this medication (dosage and times per day)? As prescribed  ? ?3. Are you having a reaction (difficulty breathing--STAT)? No  ? ?4. What is your medication issue? Patient is calling in regards to his previous clearance given for his knee replacement. He states he was originally scheduled to have the surgery on Wednesday 03/22, but they called him Tuesday 03/21 to cancel the procedure due to insurance purposes. The patient had already been off Eliquis for 3 days at that time the procedure was canceled. Due to the cancellation he started back taking it on Tuesday night and Wednesday. In total he has taken 3 tablets of eliquis since stopping. One on Tuesday, two on Wednesday. His surgery has now been rescheduled and he is needing to hold the eliquis again for three days prior which would start tomorrow. He is wanting to confirm it is okay to do this due to the recent off and on of the eliquis. Please advise.  ?

## 2021-12-23 NOTE — Telephone Encounter (Signed)
Preoperative team, patient has  been cleared for upcoming surgery.  Please call and review recommendations made by pharmacy.  Thank you. ? ?Jossie Ng. Shellene Sweigert NP-C ? ?  ?12/23/2021, 1:52 PM ?[place] ?[place] [address] ?Office [phone] Fax [phone] ? ?

## 2022-01-10 ENCOUNTER — Ambulatory Visit (INDEPENDENT_AMBULATORY_CARE_PROVIDER_SITE_OTHER): Payer: Medicare Other

## 2022-01-10 DIAGNOSIS — I4729 Other ventricular tachycardia: Secondary | ICD-10-CM | POA: Diagnosis not present

## 2022-01-12 ENCOUNTER — Telehealth: Payer: Self-pay

## 2022-01-12 LAB — CUP PACEART REMOTE DEVICE CHECK
Battery Voltage: 2.81 V
Brady Statistic AP VP Percent: 0 %
Brady Statistic AP VS Percent: 0.92 %
Brady Statistic AS VP Percent: 0.01 %
Brady Statistic AS VS Percent: 99.07 %
Brady Statistic RA Percent Paced: 0.92 %
Brady Statistic RV Percent Paced: 0.01 %
Date Time Interrogation Session: 20230410104829
Implantable Lead Implant Date: 20120628
Implantable Lead Implant Date: 20120628
Implantable Lead Location: 753859
Implantable Lead Location: 753860
Implantable Pulse Generator Implant Date: 20120628
Lead Channel Impedance Value: 408 Ohm
Lead Channel Impedance Value: 464 Ohm
Lead Channel Sensing Intrinsic Amplitude: 2.827 mV
Lead Channel Sensing Intrinsic Amplitude: 3.07 mV
Lead Channel Setting Pacing Amplitude: 2 V
Lead Channel Setting Pacing Pulse Width: 0.4 ms
Lead Channel Setting Sensing Sensitivity: 0.9 mV

## 2022-01-12 NOTE — Telephone Encounter (Signed)
Pacemaker has reached RRT 12/29/21. Attemped to contact patient to advise. No answer, LMTCB. ?

## 2022-01-12 NOTE — Telephone Encounter (Signed)
I spoke to Nathan Harding about the fact that his pacemaker has reached elective replacement indicator as of March 29. ?He is not pacemaker dependent.  Device function is otherwise normal. ?He has recently undergone total knee replacement surgery and is undergoing rehab for the next few weeks. ?Scheduled him for elective pacemaker generator change out on Feb 07, 2022 at 1330h.  He will need to have labs performed near to that date.  Anticipate outpatient procedure with discharge same day.   ?This procedure has been fully reviewed with the patient and informed consent has been obtained. ? ?

## 2022-01-13 ENCOUNTER — Telehealth: Payer: Self-pay | Admitting: Cardiovascular Disease

## 2022-01-13 NOTE — Telephone Encounter (Signed)
Please do not change the carvedilol, please cut the lisinopril in half to 10 mg daily ?

## 2022-01-13 NOTE — Telephone Encounter (Signed)
Pt c/o BP issue: STAT if pt c/o blurred vision, one-sided weakness or slurred speech ? ?1. What are your last 5 BP readings? Yesterday at PCP's office upper number low laying down 108, sitting 102, standing 98  ? ?2. Are you having any other symptoms (ex. Dizziness, headache, blurred vision, passed out)? Dizziness, blurred vision, but not now  ? ?3. What is your BP issue? Patient states his BP has been very low. He says his PCP said it may be due to blood loss from his surgery recently. He would like to know if he can stop his BP medications until his BP starts to increase. He states he takes carvedilol and lisinopril.  ?

## 2022-01-13 NOTE — Telephone Encounter (Signed)
-  Pt called to report he was recently seen by PCP and BP was running low (laying-108, sitting 102, standing 98).  ?-He also report occasional dizziness and blurred vision after taking morning medications. Pt denies symptoms now ?-Pt state pcp believe low BP could be due to blood loss from recent total knee replacement on 3/27. He state pcp indicated his hgb was low but didn't know value. ?-Pt is questioning if Dr. Loletha Grayer feels he should temporarily stop either lisinopril, coreg, or both.   ?             ? ? ? ?

## 2022-01-14 MED ORDER — LISINOPRIL 10 MG PO TABS: 10.0000 mg | ORAL_TABLET | Freq: Every day | ORAL | 3 refills | Status: DC

## 2022-01-14 NOTE — Telephone Encounter (Signed)
-  With verbal permission from pt, wife made aware and MD's recommendations and verbalized understanding. ?-Med list updated to reflect change. ?

## 2022-01-17 ENCOUNTER — Encounter: Payer: Self-pay | Admitting: *Deleted

## 2022-01-17 ENCOUNTER — Other Ambulatory Visit: Payer: Self-pay | Admitting: *Deleted

## 2022-01-17 DIAGNOSIS — Z01810 Encounter for preprocedural cardiovascular examination: Secondary | ICD-10-CM

## 2022-01-17 DIAGNOSIS — I480 Paroxysmal atrial fibrillation: Secondary | ICD-10-CM

## 2022-01-19 ENCOUNTER — Telehealth: Payer: Self-pay | Admitting: Cardiovascular Disease

## 2022-01-19 NOTE — Telephone Encounter (Signed)
Message routed to device clinic/MD to review ?

## 2022-01-19 NOTE — Telephone Encounter (Signed)
No need for antibiotics. This is a sterile procedure. ?

## 2022-01-19 NOTE — Telephone Encounter (Signed)
Patient had knee replacement in March. He needed to know if he needs to take antibiotics prior to his PPM Gen change 02/07/22. Please advise ?

## 2022-01-19 NOTE — Telephone Encounter (Signed)
Patient called with MD advice. Voiced understanding.  ?

## 2022-01-27 NOTE — Progress Notes (Signed)
Remote pacemaker transmission.   

## 2022-02-02 ENCOUNTER — Other Ambulatory Visit: Payer: Self-pay

## 2022-02-02 DIAGNOSIS — Z01810 Encounter for preprocedural cardiovascular examination: Secondary | ICD-10-CM

## 2022-02-02 DIAGNOSIS — I480 Paroxysmal atrial fibrillation: Secondary | ICD-10-CM

## 2022-02-03 LAB — BASIC METABOLIC PANEL
BUN/Creatinine Ratio: 11 (ref 10–24)
BUN: 15 mg/dL (ref 8–27)
CO2: 19 mmol/L — ABNORMAL LOW (ref 20–29)
Calcium: 9 mg/dL (ref 8.6–10.2)
Chloride: 96 mmol/L (ref 96–106)
Creatinine, Ser: 1.33 mg/dL — ABNORMAL HIGH (ref 0.76–1.27)
Glucose: 121 mg/dL — ABNORMAL HIGH (ref 70–99)
Potassium: 5 mmol/L (ref 3.5–5.2)
Sodium: 129 mmol/L — ABNORMAL LOW (ref 134–144)
eGFR: 58 mL/min/{1.73_m2} — ABNORMAL LOW (ref >59)

## 2022-02-03 LAB — CBC
Hematocrit: 35.5 % — ABNORMAL LOW (ref 37.5–51.0)
Hemoglobin: 11.7 g/dL — ABNORMAL LOW (ref 13.0–17.7)
MCH: 30.5 pg (ref 26.6–33.0)
MCHC: 33 g/dL (ref 31.5–35.7)
MCV: 93 fL (ref 79–97)
Platelets: 281 10*3/uL (ref 150–450)
RBC: 3.83 x10E6/uL — ABNORMAL LOW (ref 4.14–5.80)
RDW: 12.7 % (ref 11.6–15.4)
WBC: 8.1 10*3/uL (ref 3.4–10.8)

## 2022-02-05 ENCOUNTER — Other Ambulatory Visit: Payer: Self-pay | Admitting: *Deleted

## 2022-02-05 DIAGNOSIS — Z950 Presence of cardiac pacemaker: Secondary | ICD-10-CM

## 2022-02-06 NOTE — Addendum Note (Signed)
Addended by: Ricci Barker on: 02/06/2022 12:15 PM ? ? Modules accepted: Orders ? ?

## 2022-02-07 ENCOUNTER — Encounter (HOSPITAL_COMMUNITY)
Admission: RE | Disposition: A | Discharge status: Home / Self Care | Payer: Self-pay | Source: Home / Self Care | Attending: Cardiovascular Disease

## 2022-02-07 ENCOUNTER — Other Ambulatory Visit: Payer: Self-pay

## 2022-02-07 ENCOUNTER — Ambulatory Visit (HOSPITAL_COMMUNITY)
Admission: RE | Admit: 2022-02-07 | Discharge: 2022-02-07 | Disposition: A | Discharge status: Home / Self Care | Payer: Medicare Other | Attending: Cardiovascular Disease | Admitting: Cardiovascular Disease

## 2022-02-07 DIAGNOSIS — I10 Essential (primary) hypertension: Secondary | ICD-10-CM | POA: Diagnosis not present

## 2022-02-07 DIAGNOSIS — E119 Type 2 diabetes mellitus without complications: Secondary | ICD-10-CM | POA: Diagnosis not present

## 2022-02-07 DIAGNOSIS — I480 Paroxysmal atrial fibrillation: Secondary | ICD-10-CM | POA: Diagnosis not present

## 2022-02-07 DIAGNOSIS — Z45010 Encounter for checking and testing of cardiac pacemaker pulse generator [battery]: Secondary | ICD-10-CM

## 2022-02-07 DIAGNOSIS — E782 Mixed hyperlipidemia: Secondary | ICD-10-CM | POA: Diagnosis not present

## 2022-02-07 DIAGNOSIS — Z950 Presence of cardiac pacemaker: Secondary | ICD-10-CM

## 2022-02-07 DIAGNOSIS — E871 Hypo-osmolality and hyponatremia: Secondary | ICD-10-CM | POA: Diagnosis not present

## 2022-02-07 DIAGNOSIS — I2510 Atherosclerotic heart disease of native coronary artery without angina pectoris: Secondary | ICD-10-CM | POA: Insufficient documentation

## 2022-02-07 DIAGNOSIS — Z7984 Long term (current) use of oral hypoglycemic drugs: Secondary | ICD-10-CM | POA: Insufficient documentation

## 2022-02-07 DIAGNOSIS — G9001 Carotid sinus syncope: Secondary | ICD-10-CM | POA: Diagnosis not present

## 2022-02-07 HISTORY — PX: PPM GENERATOR CHANGEOUT: EP1233

## 2022-02-07 LAB — BASIC METABOLIC PANEL
Anion gap: 11 (ref 5–15)
BUN: 12 mg/dL (ref 8–23)
CO2: 22 mmol/L (ref 22–32)
Calcium: 8.9 mg/dL (ref 8.9–10.3)
Chloride: 101 mmol/L (ref 98–111)
Creatinine, Ser: 1.09 mg/dL (ref 0.61–1.24)
GFR, Estimated: >60 mL/min (ref >60)
Glucose, Bld: 136 mg/dL — ABNORMAL HIGH (ref 70–99)
Potassium: 4.3 mmol/L (ref 3.5–5.1)
Sodium: 134 mmol/L — ABNORMAL LOW (ref 135–145)

## 2022-02-07 LAB — GLUCOSE, CAPILLARY
Glucose-Capillary: 141 mg/dL — ABNORMAL HIGH (ref 70–99)
Glucose-Capillary: 93 mg/dL (ref 70–99)

## 2022-02-07 SURGERY — PPM GENERATOR CHANGEOUT

## 2022-02-07 MED ORDER — LIDOCAINE HCL (PF) 1 % IJ SOLN
INTRAMUSCULAR | Status: DC | PRN
  Administered 2022-02-07: 60 mg

## 2022-02-07 MED ORDER — SODIUM CHLORIDE 0.9 % IV SOLN
80.0000 mg | INTRAVENOUS | Status: AC
  Administered 2022-02-07: 80 mg
  Filled 2022-02-07: qty 2

## 2022-02-07 MED ORDER — CHLORHEXIDINE GLUCONATE 4 % EX LIQD
4.0000 "application " | Freq: Once | CUTANEOUS | Status: AC
  Administered 2022-02-07: 4 via TOPICAL
  Filled 2022-02-07: qty 60

## 2022-02-07 MED ORDER — CEFAZOLIN SODIUM-DEXTROSE 2-4 GM/100ML-% IV SOLN
2.0000 g | INTRAVENOUS | Status: AC
  Administered 2022-02-07: 2 g via INTRAVENOUS
  Filled 2022-02-07: qty 100

## 2022-02-07 MED ORDER — LIDOCAINE HCL (PF) 1 % IJ SOLN
INTRAMUSCULAR | Status: AC
  Filled 2022-02-07: qty 60

## 2022-02-07 MED ORDER — ACETAMINOPHEN 325 MG PO TABS: 325.0000 mg | ORAL_TABLET | ORAL | Status: DC | PRN

## 2022-02-07 MED ORDER — ONDANSETRON HCL 4 MG/2ML IJ SOLN: 4.0000 mg | Freq: Four times a day (QID) | INTRAMUSCULAR | Status: DC | PRN

## 2022-02-07 MED ORDER — SODIUM CHLORIDE 0.9% FLUSH: 3.0000 mL | Freq: Two times a day (BID) | INTRAVENOUS | Status: DC

## 2022-02-07 MED ORDER — SODIUM CHLORIDE 0.9 % IV SOLN: 250.0000 mL | INTRAVENOUS | Status: DC | PRN

## 2022-02-07 MED ORDER — SODIUM CHLORIDE 0.9 % IV SOLN
INTRAVENOUS | Status: AC
  Filled 2022-02-07: qty 2

## 2022-02-07 MED ORDER — SODIUM CHLORIDE 0.9% FLUSH: 3.0000 mL | INTRAVENOUS | Status: DC | PRN

## 2022-02-07 MED ORDER — SODIUM CHLORIDE 0.9 % IV SOLN: INTRAVENOUS | Status: DC

## 2022-02-07 MED ORDER — APIXABAN 5 MG PO TABS: 5.0000 mg | ORAL_TABLET | Freq: Two times a day (BID) | ORAL | 1 refills | Status: DC

## 2022-02-07 MED ORDER — CEFAZOLIN SODIUM-DEXTROSE 2-4 GM/100ML-% IV SOLN
INTRAVENOUS | Status: AC
  Filled 2022-02-07: qty 100

## 2022-02-07 SURGICAL SUPPLY — 5 items
CABLE SURGICAL S-101-97-12 (CABLE) ×2 IMPLANT
IPG PACE AZUR XT DR MRI W1DR01 (Pacemaker) IMPLANT
PACE AZURE XT DR MRI W1DR01 (Pacemaker) ×2 IMPLANT
PAD DEFIB RADIO PHYSIO CONN (PAD) ×2 IMPLANT
TRAY PACEMAKER INSERTION (PACKS) ×2 IMPLANT

## 2022-02-07 NOTE — Discharge Instructions (Signed)

## 2022-02-07 NOTE — Op Note (Signed)
Procedure report  ?Procedure performed:  ?Dual chamber pacemaker generator changeout  ? ?Reason for procedure:  ?1. Device generator at elective replacement interval  ?2. Carotid sinus hypersensitivity ?Procedure performed by:  ?Sanda Klein, MD  ?Complications:  ?None  ?Estimated blood loss:  ?<5 mL  ?Medications administered during procedure:  ?Ancef 2 g intravenously, lidocaine 1% 30 mL locally ?Device details:  ? ?New Generator Medtronic Azure XT DR model number [id number], serial number [id number] ?Right atrial lead (chronic) Medtronic, model number [id number], serial number [id number] (implanted 03/31/2011) ?Right ventricular lead (chronic) Medtronic, model number [id number], serial number [id number] (implanted 03/31/2011) ? ? ?Explanted generator Medtronic Revo,  model number [id number], serial number  [id number] (implanted 03/31/2011) ? ?Procedure details:  ?After the risks and benefits of the procedure were discussed the patient provided informed consent. She was brought to the cardiac catheter lab in the fasting state. The patient was prepped and draped in usual sterile fashion. Local anesthesia with 1% lidocaine was administered to to the left infraclavicular area. A 5-6cm horizontal incision was made parallel with and 2-3 cm caudal to the left clavicle, in the area of an old scar. An older scar was seen closer to the left clavicle. Using minimal electrocautery and mostly sharp and blunt dissection the prepectoral pocket was opened carefully to avoid injury to the loops of chronic leads. Extensive dissection was not necessary. The device was explanted. The pocket was carefully inspected for hemostasis and flushed with copious amounts of antibiotic solution. ? ?The leads were disconnected from the old generator and testing of the lead parameters later showed excellent values. The new generator was connected to the chronic leads, with appropriate pacing noted.  ? ?The entire system was then carefully inserted in the pocket  with care been taking that the leads and device assumed a comfortable position without pressure on the incision. Great care was taken that the leads be located deep to the generator. The pocket was then closed in layers using 2 layers of 2-0 Vicryl, one layer of 3-0 Vicryl and cutaneous steristrips after which a sterile dressing was applied.  ? ?At the end of the procedure the following lead parameters were encountered:  ? ?Right atrial lead sensed P waves 2.9 mV, impedance 456 ohms, threshold 0.5 at 0.4 ms pulse width. ? ?Right ventricular lead sensed R waves  3.0 mV, impedance 399 ohms, threshold 0.75 at 0.4 ms pulse width. ? ? ?Sanda Klein, MD, [facility] ?[place] ?([phone] office ?[phone] pager ? ?

## 2022-02-07 NOTE — H&P (Addendum)
? ?Cardiology Admission History and Physical:  ? ?Patient ID: Nathan Harding ?MRN: [id number]; DOB: July 02, 1951  ? ?Admission date: 02/07/2022 ? ?PCP:  Nathan Hazard, MD ?  ?[facility] Providers ?Cardiologist:  Sanda Klein, MD      ? ? ?Chief Complaint: Pacemaker battery depletion ? ?Patient Profile:  ? ?Nathan Harding is a 71 y.o. male with history of carotid sinus hypersensitivity syndrome and syncope  who is being seen 02/07/2022 for pacemaker at end of battery service life. ? ?History of Present Illness:  ? ?[name] has not had any syncopal events since implantation of his pacemaker in 2012.  Additional medical problems include very infrequent episodes of paroxysmal atrial fibrillation and rare episodes of nonsustained VT, essential hypertension, diabetes mellitus, mixed hyperlipidemia and remote history of nonobstructive mild CAD at the time of cardiac catheterization in 2010. ? ?He will need has atrial pacing 2-3% of the time and he does not require ventricular pacing.  Lead parameters are normal.  He has not had any recent atrial fibrillation. ? ?The patient specifically denies any chest pain at rest exertion, dyspnea at rest or with exertion, orthopnea, paroxysmal nocturnal dyspnea, syncope, palpitations, focal neurological deficits, intermittent claudication, lower extremity edema, unexplained weight gain, cough, hemoptysis or wheezing.  He has not had falls injuries or bleeding problems. ? ? ? ?Past Medical History:  ?Diagnosis Date  ? Allergy   ? Anxiety   ? Arthritis   ? Cancer [facility])   ? skin- Basal Cell CA  ? Cardiac pacemaker   ? Carotid sinus syndrome with bradycardia   ? Depression   ? Diabetes mellitus   ? ED (erectile dysfunction)   ? GERD (gastroesophageal reflux disease)   ? Hyperlipidemia   ? Hypertension   ? Male urinary stress incontinence   ? Prostate cancer [facility]) 2013  ? Right renal mass   ? Severe sinus bradycardia   ? Sleep apnea   ? uses CPAP  ? Ulcer   ? ? ?Past Surgical  History:  ?Procedure Laterality Date  ? COLONOSCOPY    ? PACEMAKER INSERTION  03/31/11  ? due to hypersensitive carotid sinus syndrome  ? PENILE PROSTHESIS IMPLANT    ? POLYPECTOMY    ? PROSTATECTOMY    ? ROTATOR CUFF REPAIR  2010  ? right  ? SHOULDER SURGERY  2002  ? right  ? temporal mastoidectomy  2009  ? behind left ear  ?  ? ?Medications Prior to Admission: ?Prior to Admission medications   ?Medication Sig Start Date End Date Taking? Authorizing Provider  ?acetaminophen (TYLENOL) 500 MG tablet Take 1,000 mg by mouth daily as needed for moderate pain.   Yes [provider]  ?apixaban (ELIQUIS) 5 MG TABS tablet TAKE 1 TABLET(5 MG) BY MOUTH TWICE DAILY 12/20/21  Yes Seren Chaloux, MD  ?carvedilol (COREG) 6.25 MG tablet Take 6.25 mg by mouth 2 (two) times daily with a meal.     Yes [provider]  ?cyclobenzaprine (FLEXERIL) 10 MG tablet Take 10 mg by mouth 3 (three) times daily as needed for muscle spasms. 12/21/21  Yes [provider]  ?famotidine (PEPCID) 40 MG tablet Take 40 mg by mouth at bedtime. 01/30/19  Yes [provider]  ?glimepiride (AMARYL) 4 MG tablet Take 4 mg by mouth daily with breakfast.   Yes [provider]  ?lisinopril (ZESTRIL) 10 MG tablet Take 1 tablet (10 mg total) by mouth daily. 01/14/22  Yes Marielis Samara, Dani Gobble, MD  ?metFORMIN (GLUCOPHAGE-XR)  500 MG 24 hr tablet Take 2,000 mg by mouth daily. 02/19/15  Yes [provider]  ?PARoxetine (PAXIL) 20 MG tablet Take 20 mg by mouth daily.   Yes [provider]  ?rosuvastatin (CRESTOR) 20 MG tablet Take 1 tablet (20 mg total) by mouth daily. NEED OV. ?Patient taking differently: Take 20 mg by mouth at bedtime. NEED OV. 09/18/17  Yes Janazia Schreier, MD  ?fenofibrate (TRICOR) 145 MG tablet Take 1 tablet (145 mg total) by mouth daily. ?Patient not taking: Reported on 02/02/2022 10/15/21   Topaz Raglin, Dani Gobble, MD  ?ramipril (ALTACE) 5 MG capsule Take 5 mg by mouth 2 (two) times daily.    12/13/11   [provider]  ?  ? ?Allergies:    ?Allergies  ?Allergen Reactions  ? Bactrim [Sulfamethoxazole-Trimethoprim] Rash  ?  Dizziness   ? ? ?Social History:   ?Social History  ? ?Socioeconomic History  ? Marital status: Married  ?  Spouse name: Not on file  ? Number of children: Not on file  ? Years of education: Not on file  ? Highest education level: Not on file  ?Occupational History  ? Not on file  ?Tobacco Use  ? Smoking status: Former  ? Smokeless tobacco: Never  ?Substance and Sexual Activity  ? Alcohol use: Yes  ?  Alcohol/week: 6.0 standard drinks  ?  Types: 6 Cans of beer per week  ?  Comment: no drank in one month  -  updated 12/12/11   update 1-6 pack every 2 weeks  ? Drug use: No  ? Sexual activity: Not on file  ?Other Topics Concern  ? Not on file  ?Social History Narrative  ? Not on file  ? ?Social Determinants of Health  ? ?Financial Resource Strain: Not on file  ?Food Insecurity: Not on file  ?Transportation Needs: Not on file  ?Physical Activity: Not on file  ?Stress: Not on file  ?Social Connections: Not on file  ?Intimate Partner Violence: Not on file  ?  ?Family History:   ?The patient's family history includes Cancer in his mother; Diabetes in his father; Esophageal cancer in his cousin; Hypertension in his brother and brother. There is no history of Colon cancer, Stomach cancer, or Rectal cancer.   ? ?ROS:  ?Please see the history of present illness.  ?All other ROS reviewed and negative.    ? ?Physical Exam/Data:  ? ?Vitals:  ? 02/07/22 1226  ?BP: 126/84  ?Pulse: 82  ?Resp: 17  ?Temp: 98.9 ?F (37.2 ?C)  ?TempSrc: Oral  ?SpO2: 97%  ?Weight: 95.3 kg  ?Height: '5\' 10"'$  (1.778 m)  ? ?No intake or output data in the 24 hours ending 02/07/22 1311 ? ?  02/07/2022  ? 12:26 PM 07/29/2021  ? 11:05 AM 03/11/2020  ? 11:10 AM  ?Last 3 Weights  ?Weight (lbs) 210 lb 215 lb 9.6 oz 218 lb 3.2 oz  ?Weight (kg) 95.255 kg 97.796 kg 98.975 kg  ?   ?Body mass index is 30.13 kg/m?.  ?General:  Well nourished, well  developed, in no acute distress ?HEENT: normal ?Neck: no JVD ?Vascular: No carotid bruits; Distal pulses 2+ bilaterally   ?Cardiac:  normal S1, S2; RRR; no murmur  ?Lungs:  clear to auscultation bilaterally, no wheezing, rhonchi or rales  ?Abd: soft, nontender, no hepatomegaly  ?Ext: no edema ?Musculoskeletal:  No deformities, BUE and BLE strength normal and equal ?Skin: warm and dry  ?Neuro:  CNs 2-12 intact, no focal abnormalities noted ?Psych:  Normal affect  ?Healthy left subclavian pacemaker site ? ? ?EKG:  The ECG  is pending ? ?Relevant CV Studies: ? ? ?Laboratory Data: ? ?High Sensitivity Troponin:  No results for input(s): TROPONINIHS in the last 720 hours.    ?Chemistry ?Recent Labs  ?Lab 02/02/22 ?1356  ?NA 129*  ?K 5.0  ?CL 96  ?CO2 19*  ?GLUCOSE 121*  ?BUN 15  ?CREATININE 1.33*  ?CALCIUM 9.0  ?  ?No results for input(s): PROT, ALBUMIN, AST, ALT, ALKPHOS, BILITOT in the last 168 hours. ?Lipids No results for input(s): CHOL, TRIG, HDL, LABVLDL, LDLCALC, CHOLHDL in the last 168 hours. ?Hematology ?Recent Labs  ?Lab 02/02/22 ?1356  ?WBC 8.1  ?RBC 3.83*  ?HGB 11.7*  ?HCT 35.5*  ?MCV 93  ?MCH 30.5  ?MCHC 33.0  ?RDW 12.7  ?PLT 281  ? ?Thyroid No results for input(s): TSH, FREET4 in the last 168 hours. ?BNPNo results for input(s): BNP, PROBNP in the last 168 hours.  ?DDimer No results for input(s): DDIMER in the last 168 hours. ? ? ?Radiology/Studies:  ?No results found. ? ? ?Assessment and Plan:  ? ?Pacemaker battery depletion: This procedure has been fully reviewed with the patient and written informed consent has been obtained. ?Hyponatremia: recheck labs today. No clear etiology. Asymptomatic. ? ? ? ?Risk Assessment/Risk Scores:  ?  ?  ?  ?  ? ?For questions or updates, please contact [place] ?Please consult [email or web address] for contact info under  ? ?  ?Signed, ?Sanda Klein, MD  ?02/07/2022 1:11 PM   ?

## 2022-02-08 ENCOUNTER — Encounter (HOSPITAL_COMMUNITY): Payer: Self-pay | Admitting: Cardiovascular Disease

## 2022-02-17 ENCOUNTER — Ambulatory Visit (INDEPENDENT_AMBULATORY_CARE_PROVIDER_SITE_OTHER): Payer: Medicare Other

## 2022-02-17 DIAGNOSIS — I4729 Other ventricular tachycardia: Secondary | ICD-10-CM

## 2022-02-17 LAB — CUP PACEART INCLINIC DEVICE CHECK
Battery Remaining Longevity: 175 mo
Battery Voltage: 3.22 V
Brady Statistic AP VP Percent: 9.32 %
Brady Statistic AP VS Percent: 0.01 %
Brady Statistic AS VP Percent: 0.19 %
Brady Statistic AS VS Percent: 90.49 %
Brady Statistic RA Percent Paced: 9.32 %
Brady Statistic RV Percent Paced: 9.5 %
Date Time Interrogation Session: 20230518104300
Implantable Lead Implant Date: 20120628
Implantable Lead Implant Date: 20120628
Implantable Lead Location: 753859
Implantable Lead Location: 753860
Implantable Pulse Generator Implant Date: 20230508
Lead Channel Impedance Value: 342 Ohm
Lead Channel Impedance Value: 361 Ohm
Lead Channel Impedance Value: 418 Ohm
Lead Channel Impedance Value: 456 Ohm
Lead Channel Pacing Threshold Amplitude: 0.5 V
Lead Channel Pacing Threshold Amplitude: 0.875 V
Lead Channel Pacing Threshold Pulse Width: 0.4 ms
Lead Channel Pacing Threshold Pulse Width: 0.4 ms
Lead Channel Sensing Intrinsic Amplitude: 2.875 mV
Lead Channel Sensing Intrinsic Amplitude: 3.125 mV
Lead Channel Sensing Intrinsic Amplitude: 3.375 mV
Lead Channel Sensing Intrinsic Amplitude: 3.75 mV
Lead Channel Setting Pacing Amplitude: 1.5 V
Lead Channel Setting Pacing Amplitude: 2.25 V
Lead Channel Setting Pacing Pulse Width: 0.4 ms
Lead Channel Setting Sensing Sensitivity: 1.2 mV

## 2022-02-17 NOTE — Progress Notes (Signed)
Wound check appointment. Steri-strips removed. Wound without redness or edema. Incision edges approximated, wound well healed. Normal device function. Thresholds, sensing, and impedances consistent with implant measurements. Device programmed at chronic outputs, no new leads implanted. Histogram distribution appropriate for patient and level of activity. AT/AF burden <0.1%. No high ventricular rates noted. Patient educated about wound care, arm mobility, lifting restrictions. ROV in 3 months with implanting physician.

## 2022-02-17 NOTE — Patient Instructions (Signed)

## 2022-05-10 ENCOUNTER — Ambulatory Visit (INDEPENDENT_AMBULATORY_CARE_PROVIDER_SITE_OTHER): Payer: Medicare Other

## 2022-05-10 DIAGNOSIS — Z950 Presence of cardiac pacemaker: Secondary | ICD-10-CM | POA: Diagnosis not present

## 2022-05-11 LAB — CUP PACEART REMOTE DEVICE CHECK
Battery Remaining Longevity: 168 mo
Battery Voltage: 3.2 V
Brady Statistic AP VP Percent: 11.29 %
Brady Statistic AP VS Percent: 0.02 %
Brady Statistic AS VP Percent: 3.14 %
Brady Statistic AS VS Percent: 85.56 %
Brady Statistic RA Percent Paced: 11.3 %
Brady Statistic RV Percent Paced: 14.43 %
Date Time Interrogation Session: 20230809133106
Implantable Lead Implant Date: 20120628
Implantable Lead Implant Date: 20120628
Implantable Lead Location: 753859
Implantable Lead Location: 753860
Implantable Pulse Generator Implant Date: 20230508
Lead Channel Impedance Value: 342 Ohm
Lead Channel Impedance Value: 380 Ohm
Lead Channel Impedance Value: 418 Ohm
Lead Channel Impedance Value: 475 Ohm
Lead Channel Pacing Threshold Amplitude: 0.5 V
Lead Channel Pacing Threshold Amplitude: 1.25 V
Lead Channel Pacing Threshold Pulse Width: 0.4 ms
Lead Channel Pacing Threshold Pulse Width: 0.4 ms
Lead Channel Sensing Intrinsic Amplitude: 2.5 mV
Lead Channel Sensing Intrinsic Amplitude: 2.5 mV
Lead Channel Sensing Intrinsic Amplitude: 2.625 mV
Lead Channel Sensing Intrinsic Amplitude: 2.625 mV
Lead Channel Setting Pacing Amplitude: 1.5 V
Lead Channel Setting Pacing Amplitude: 2.5 V
Lead Channel Setting Pacing Pulse Width: 0.4 ms
Lead Channel Setting Sensing Sensitivity: 1.2 mV

## 2022-05-23 ENCOUNTER — Encounter: Payer: Self-pay | Admitting: Cardiovascular Disease

## 2022-05-23 ENCOUNTER — Ambulatory Visit (INDEPENDENT_AMBULATORY_CARE_PROVIDER_SITE_OTHER): Payer: Medicare Other | Admitting: Cardiovascular Disease

## 2022-05-23 VITALS — BP 122/82 | HR 88 | Ht 70.0 in | Wt 215.4 lb

## 2022-05-23 DIAGNOSIS — Z950 Presence of cardiac pacemaker: Secondary | ICD-10-CM

## 2022-05-23 DIAGNOSIS — D6869 Other thrombophilia: Secondary | ICD-10-CM | POA: Diagnosis not present

## 2022-05-23 DIAGNOSIS — E782 Mixed hyperlipidemia: Secondary | ICD-10-CM

## 2022-05-23 DIAGNOSIS — E669 Obesity, unspecified: Secondary | ICD-10-CM

## 2022-05-23 DIAGNOSIS — I10 Essential (primary) hypertension: Secondary | ICD-10-CM

## 2022-05-23 DIAGNOSIS — I2510 Atherosclerotic heart disease of native coronary artery without angina pectoris: Secondary | ICD-10-CM

## 2022-05-23 DIAGNOSIS — E1169 Type 2 diabetes mellitus with other specified complication: Secondary | ICD-10-CM

## 2022-05-23 DIAGNOSIS — I480 Paroxysmal atrial fibrillation: Secondary | ICD-10-CM | POA: Diagnosis not present

## 2022-05-23 DIAGNOSIS — I4729 Other ventricular tachycardia: Secondary | ICD-10-CM

## 2022-05-23 DIAGNOSIS — G9001 Carotid sinus syncope: Secondary | ICD-10-CM

## 2022-05-23 NOTE — Progress Notes (Signed)
Cardiology office note   Date:  05/23/2022   ID:  Nathan Harding, DOB January 04, 1951, MRN [id number]  PCP:  Wynonia Hazard, MD  Cardiologist:  Jaze Rodino Electrophysiologist:  None   Evaluation Performed:  Follow-Up Visit  Chief Complaint: Pacemaker follow-up  History of Present Illness:    Nathan Harding is a 71 y.o. 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  He underwent pacemaker generator change out roughly 3 months ago.  The site has healed well.  He has not had any problems with fever, chills, redness or drainage at the site or swelling.  He has no cardiovascular complaints.  The patient specifically denies any chest pain at rest exertion, dyspnea at rest or with exertion, orthopnea, paroxysmal nocturnal dyspnea, syncope, palpitations, focal neurological deficits, intermittent claudication, lower extremity edema, unexplained weight gain, cough, hemoptysis or wheezing.  He has recovered well from left total knee replacement performed almost 6 months ago. [place] in [place] in [place] and is able to perform fairly intense physical labor without complaints.    Diabetes control is suboptimal with a hemoglobin A1c of 7.9%.  He has a weakness for potatoes and bread.  He will have repeat labs including a cholesterol profile performed with his primary care provider next month  His most recent hemoglobin A1c has worsened, going up to 7.7% and he is committed to a new diet and is sure that he will see improvement.  Pacemaker interrogation shows normal device function.  The battery is at beginning of life with an estimated 14 years of  longevity.  He only has 11% atrial pacing and 14.5% ventricular pacing.  He has not had ventricular tachycardia or atrial fibrillation since the generator was changed.  Roughly 3000 episodes of "rate drop" have occurred in the last 3 months.  Past Medical History:  Diagnosis Date   Allergy    Anxiety    Arthritis    Cancer ([place])    skin- Basal Cell CA   Cardiac pacemaker    Carotid sinus syndrome with bradycardia    Depression    Diabetes mellitus    ED (erectile dysfunction)    GERD (gastroesophageal reflux disease)    Hyperlipidemia    Hypertension    Male urinary stress incontinence    Prostate cancer ([place]) 2013   Right renal mass    Severe sinus bradycardia    Sleep apnea    uses CPAP   Ulcer    Past Surgical History:  Procedure Laterality Date   COLONOSCOPY     PACEMAKER INSERTION  03/31/11   due to hypersensitive carotid sinus syndrome   PENILE PROSTHESIS IMPLANT     POLYPECTOMY     PPM GENERATOR CHANGEOUT N/A 02/07/2022   Procedure: PPM GENERATOR CHANGEOUT;  Surgeon: Sanda Klein, MD;  Location: [facility];  Service: Cardiovascular;  Laterality: N/A;   PROSTATECTOMY     ROTATOR CUFF REPAIR  2010   right   SHOULDER SURGERY  2002   right   temporal mastoidectomy  2009   behind left ear     Current Meds  Medication Sig   acetaminophen (TYLENOL) 500 MG tablet Take 1,000 mg by mouth daily as needed for moderate  pain.   apixaban (ELIQUIS) 5 MG TABS tablet Take 1 tablet (5 mg total) by mouth 2 (two) times daily.   carvedilol (COREG) 6.25 MG tablet Take 6.25 mg by mouth 2 (two) times daily with a meal.     famotidine (PEPCID) 40 MG tablet Take 40 mg by mouth at bedtime.   glimepiride (AMARYL) 4 MG tablet Take 4 mg by mouth daily with breakfast.   lisinopril (ZESTRIL) 10 MG tablet Take 1 tablet (10 mg total) by mouth daily.   metFORMIN (GLUCOPHAGE-XR) 500 MG 24 hr tablet Take 2,000 mg by mouth daily.   PARoxetine (PAXIL) 20 MG tablet Take 20 mg by mouth  daily.   rOPINIRole (REQUIP) 0.5 MG tablet Take 0.5 mg by mouth every evening.   rosuvastatin (CRESTOR) 20 MG tablet Take 1 tablet (20 mg total) by mouth daily. NEED OV. (Patient taking differently: Take 20 mg by mouth at bedtime. NEED OV.)     Allergies:   Bactrim [sulfamethoxazole-trimethoprim]   Social History   Tobacco Use   Smoking status: Former   Smokeless tobacco: Never  Substance Use Topics   Alcohol use: Yes    Alcohol/week: 6.0 standard drinks of alcohol    Types: 6 Cans of beer per week    Comment: no drank in one month  -  updated 12/12/11   update 1-6 pack every 2 weeks   Drug use: No     Family Hx: The patient's family history includes Cancer in his mother; Diabetes in his father; Esophageal cancer in his cousin; Hypertension in his brother and brother. There is no history of Colon cancer, Stomach cancer, or Rectal cancer.  ROS:   Please see the history of present illness.  All other systems are reviewed and are negative.  Prior CV studies:   The following studies were reviewed today:  Comprehensive pacemaker check in office  Labs/Other Tests and Data Reviewed:    EKG:  an electrocardiogram was ordered at his previous appointment shows normal sinus rhythm, normal tracing.  QTc 380 ms  Recent Labs: 02/02/2022: Hemoglobin 11.7; Platelets 281 02/07/2022: BUN 12; Creatinine, Ser 1.09; Potassium 4.3; Sodium 134   Recent Lipid Panel Lab Results  Component Value Date/Time   CHOL 158 02/16/2016 10:58 AM   TRIG 163 (H) 02/16/2016 10:58 AM   HDL 58 02/16/2016 10:58 AM   CHOLHDL 2.7 02/16/2016 10:58 AM   LDLCALC 67 02/16/2016 10:58 AM    Wt Readings from Last 3 Encounters:  05/23/22 215 lb 6.4 oz (97.7 kg)  02/07/22 210 lb (95.3 kg)  07/29/21 215 lb 9.6 oz (97.8 kg)     Objective:    Vital Signs:  BP 122/82 (BP Location: Left Arm, Patient Position: Sitting, Cuff Size: Normal)   Pulse 88   Ht '5\' 10"'$  (1.778 m)   Wt 215 lb 6.4 oz (97.7 kg)   SpO2 95%   BMI  30.91 kg/m     General: Alert, oriented x3, no distress, well-healed pacemaker site.  Mildly obese Head: no evidence of trauma, PERRL, EOMI, no exophtalmos or lid lag, no myxedema, no xanthelasma; normal ears, nose and oropharynx Neck: normal jugular venous pulsations and no hepatojugular reflux; brisk carotid pulses without delay and no carotid bruits Chest: clear to auscultation, no signs of consolidation by percussion or palpation, normal fremitus, symmetrical and full respiratory excursions Cardiovascular: normal position and quality of the apical impulse, regular rhythm, normal first and second heart sounds, no murmurs, rubs or gallops Abdomen: no tenderness or  distention, no masses by palpation, no abnormal pulsatility or arterial bruits, normal bowel sounds, no hepatosplenomegaly Extremities: no clubbing, cyanosis or edema; 2+ radial, ulnar and brachial pulses bilaterally; 2+ right femoral, posterior tibial and dorsalis pedis pulses; 2+ left femoral, posterior tibial and dorsalis pedis pulses; no subclavian or femoral bruits Neurological: grossly nonfocal Psych: Normal mood and affect    ASSESSMENT & PLAN:    AFib: Episodes of voice been very infrequent and not have been recorded since the pacemaker generator change out in May.  CHADSVasc 3-4 (age, HTN, DM, +/- minor CAD).  Compliant with Eliquis. Eliquis: Well-tolerated, no bleeding complications. HTN: Well-controlled. HLP: Will have labs repeated next month.  Target LDL less than 100 for diabetes mellitus. DM: Not well controlled.  Discussed the difference between starches with high glycemic index and the carbohydrates that are more complex and have a lower glycemic index.  Follow-up with PCP next month. NSVT: These have been occasionally recorded by his device but none have been seen since his generator change out. CAD: Minor nonobstructive LAD atherosclerosis seen at time of remote cardiac catheterization 2010.  He is  asymptomatic. Carotid sinus hypersensitivity: No syncope following pacemaker implantation. Pacemaker: Site well-healed after recent generator change out.  Continue downloads every 3 months.      Medication Adjustments/Labs and Tests Ordered: Current medicines are reviewed at length with the patient today.  Concerns regarding medicines are outlined above.   Tests Ordered: No orders of the defined types were placed in this encounter.   Medication Changes: No orders of the defined types were placed in this encounter.   Disposition:  Follow up  12 months  Signed, Sanda Klein, MD  05/23/2022 2:05 PM    [place]

## 2022-05-23 NOTE — Patient Instructions (Signed)

## 2022-06-15 NOTE — Progress Notes (Signed)
Remote pacemaker transmission.   

## 2022-06-17 ENCOUNTER — Other Ambulatory Visit: Payer: Self-pay | Admitting: Cardiovascular Disease

## 2022-06-17 DIAGNOSIS — I480 Paroxysmal atrial fibrillation: Secondary | ICD-10-CM

## 2022-06-17 DIAGNOSIS — I471 Supraventricular tachycardia: Secondary | ICD-10-CM

## 2022-06-17 NOTE — Telephone Encounter (Signed)
Eliquis '5mg'$  refill request received. Patient is 71 years old, weight-97.7kg, Crea-1.09 on 02/07/2022, Diagnosis-Afib, and last seen by Dr. Sallyanne Kuster on 05/23/2022. Dose is appropriate based on dosing criteria. Will send in refill to requested pharmacy.

## 2022-08-09 ENCOUNTER — Ambulatory Visit (INDEPENDENT_AMBULATORY_CARE_PROVIDER_SITE_OTHER): Payer: Medicare Other

## 2022-08-09 DIAGNOSIS — I4729 Other ventricular tachycardia: Secondary | ICD-10-CM

## 2022-08-09 LAB — CUP PACEART REMOTE DEVICE CHECK
Battery Remaining Longevity: 163 mo
Battery Voltage: 3.17 V
Brady Statistic AP VP Percent: 11.3 %
Brady Statistic AP VS Percent: 0.02 %
Brady Statistic AS VP Percent: 12.87 %
Brady Statistic AS VS Percent: 75.81 %
Brady Statistic RA Percent Paced: 11.33 %
Brady Statistic RV Percent Paced: 24.18 %
Date Time Interrogation Session: 20231106210522
Implantable Lead Connection Status: 753985
Implantable Lead Connection Status: 753985
Implantable Lead Implant Date: 20120628
Implantable Lead Implant Date: 20120628
Implantable Lead Location: 753859
Implantable Lead Location: 753860
Implantable Pulse Generator Implant Date: 20230508
Lead Channel Impedance Value: 342 Ohm
Lead Channel Impedance Value: 380 Ohm
Lead Channel Impedance Value: 418 Ohm
Lead Channel Impedance Value: 456 Ohm
Lead Channel Pacing Threshold Amplitude: 0.5 V
Lead Channel Pacing Threshold Amplitude: 1.125 V
Lead Channel Pacing Threshold Pulse Width: 0.4 ms
Lead Channel Pacing Threshold Pulse Width: 0.4 ms
Lead Channel Sensing Intrinsic Amplitude: 2.375 mV
Lead Channel Sensing Intrinsic Amplitude: 2.375 mV
Lead Channel Sensing Intrinsic Amplitude: 2.75 mV
Lead Channel Sensing Intrinsic Amplitude: 2.75 mV
Lead Channel Setting Pacing Amplitude: 1.5 V
Lead Channel Setting Pacing Amplitude: 2.5 V
Lead Channel Setting Pacing Pulse Width: 0.4 ms
Lead Channel Setting Sensing Sensitivity: 1.2 mV
Zone Setting Status: 755011

## 2022-09-02 NOTE — Progress Notes (Signed)
Remote pacemaker transmission.   

## 2022-10-14 ENCOUNTER — Telehealth: Payer: Self-pay | Admitting: *Deleted

## 2022-10-14 NOTE — Telephone Encounter (Signed)
   Pre-operative Risk Assessment    Patient Name: Nathan Harding  DOB: 07/30/1951 MRN: [id number]      Request for Surgical Clearance    Procedure:   ARTIFICIAL URINARY SPHINCTER REMOVAL / REPLACEMENT  Date of Surgery:  Clearance 01/26/23                                 Surgeon:  Lenoria Chime, MD Surgeon's Group or Practice Name:  [facility] Phone number:  [phone] Fax number:  [phone]   Type of Clearance Requested:   - Pharmacy:  Hold Apixaban (Eliquis) X'S 5 DAYS PRIOR   Type of Anesthesia:  Not Indicated   Additional requests/questions:    Astrid Divine   10/14/2022, 3:42 PM

## 2022-10-17 NOTE — Telephone Encounter (Signed)
Marylu Lund., NP  Cv Div Preop Callback1 hour ago (11:25 AM)    Preop team please contact requesting provider office and advised that 5-day hold for Eliquis is too long and 3-day is recommended by our pharmacy team.  Please advise with further recommendations per requesting surgeon.  Ambrose Pancoast, NP

## 2022-10-17 NOTE — Telephone Encounter (Signed)
Pharmacy please advise on holding Eliquis for 5 days prior to artificial urinary sphincter removal/replacement scheduled for 01/26/2023. Thank you.    Ambrose Pancoast, NP

## 2022-10-17 NOTE — Telephone Encounter (Signed)
Patient with diagnosis of PAF(paroxysmal atrial fibrillation) on Eliquis for anticoagulation.    Procedure: artificial urinary sphincter removal / replacement  Date of procedure: 01/26/23     CHA2DS2-VASc Score = 4   This indicates a 4.8% annual risk of stroke. The patient's score is based upon: CHF History: 0 HTN History: 1 Diabetes History: 1 Stroke History: 0 Vascular Disease History: 1 Age Score: 1 Gender Score: 0      CrCl 73 ml/min (SrCr 1.09 on 02/07/2022) Platelet count 281 (on 02/02/2022)   5 days hold would be too long. Recommend to hold Eliquis for 3 days.   **This guidance is not considered finalized until pre-operative APP has relayed final recommendations.**

## 2022-10-17 NOTE — Telephone Encounter (Addendum)
Will fax a copy of pharmacist recommendations to requesting providers office.   OFFICE CLOSED TODAY.

## 2022-10-19 NOTE — Telephone Encounter (Signed)
Left a message for the requesting provider's office to give our office a call back about hold Eliquis (see previous note).

## 2022-10-19 NOTE — Telephone Encounter (Signed)
Preop callback team please reach out to requesting MD office for guidance on recommendation of holding Eliquis for 3 days versus 5 days.  Thank you

## 2022-10-20 NOTE — Telephone Encounter (Signed)
Pharmacy recommendations have been faxed over to surgeon's office.

## 2022-10-25 NOTE — Telephone Encounter (Signed)
I returned call to requesting office. Left message to call back to pre op line [phone].

## 2022-10-25 NOTE — Telephone Encounter (Signed)
Caller returned pre-op call and requested call back to [phone].

## 2022-10-26 NOTE — Telephone Encounter (Signed)
Called  to requesting office in reference to surgical clearance . Left message another message  to call back to pre op line [phone].

## 2022-10-26 NOTE — Telephone Encounter (Signed)
Dr. Earle Gell office called back with an update that Dr. Odis Luster will be fine with a 3 day hold on Eliquis, as long as it is a FULL 3 day hold. I will forward this back to pre op for final notes.

## 2022-10-26 NOTE — Telephone Encounter (Signed)
   Patient Name: Nathan Harding  DOB: Feb 11, 1951 MRN: [id number]  Primary Cardiologist: Sanda Klein, MD  Clinical pharmacists have reviewed the patient's past medical history, labs, and current medications as part of preoperative protocol coverage. The following recommendations have been made:  Patient with diagnosis of PAF(paroxysmal atrial fibrillation) on Eliquis for anticoagulation.     Procedure: artificial urinary sphincter removal / replacement  Date of procedure: 01/26/23       CHA2DS2-VASc Score = 4   This indicates a 4.8% annual risk of stroke. The patient's score is based upon: CHF History: 0 HTN History: 1 Diabetes History: 1 Stroke History: 0 Vascular Disease History: 1 Age Score: 1 Gender Score: 0       CrCl 73 ml/min (SrCr 1.09 on 02/07/2022) Platelet count 281 (on 02/02/2022)   Per office protocol, pt may hold Eliquis for 3 full days prior to procedure. Please resume Eliquis as soon as possible postprocedure, at the discretion of the surgeon.     I will route this recommendation to the requesting party via Epic fax function and remove from pre-op pool.  Please call with questions.  Lenna Sciara, NP 10/26/2022, 4:16 PM

## 2022-11-08 ENCOUNTER — Ambulatory Visit: Payer: Medicare Other

## 2022-11-08 DIAGNOSIS — I480 Paroxysmal atrial fibrillation: Secondary | ICD-10-CM

## 2022-11-08 LAB — CUP PACEART REMOTE DEVICE CHECK
Battery Remaining Longevity: 162 mo
Battery Voltage: 3.13 V
Brady Statistic AP VP Percent: 8.34 %
Brady Statistic AP VS Percent: 0.01 %
Brady Statistic AS VP Percent: 11.42 %
Brady Statistic AS VS Percent: 80.23 %
Brady Statistic RA Percent Paced: 8.35 %
Brady Statistic RV Percent Paced: 19.76 %
Date Time Interrogation Session: 20240205220046
Implantable Lead Connection Status: 753985
Implantable Lead Connection Status: 753985
Implantable Lead Implant Date: 20120628
Implantable Lead Implant Date: 20120628
Implantable Lead Location: 753859
Implantable Lead Location: 753860
Implantable Pulse Generator Implant Date: 20230508
Lead Channel Impedance Value: 342 Ohm
Lead Channel Impedance Value: 380 Ohm
Lead Channel Impedance Value: 418 Ohm
Lead Channel Impedance Value: 437 Ohm
Lead Channel Pacing Threshold Amplitude: 0.5 V
Lead Channel Pacing Threshold Amplitude: 1.125 V
Lead Channel Pacing Threshold Pulse Width: 0.4 ms
Lead Channel Pacing Threshold Pulse Width: 0.4 ms
Lead Channel Sensing Intrinsic Amplitude: 2.75 mV
Lead Channel Sensing Intrinsic Amplitude: 2.75 mV
Lead Channel Sensing Intrinsic Amplitude: 4 mV
Lead Channel Sensing Intrinsic Amplitude: 4 mV
Lead Channel Setting Pacing Amplitude: 1.5 V
Lead Channel Setting Pacing Amplitude: 2.25 V
Lead Channel Setting Pacing Pulse Width: 0.4 ms
Lead Channel Setting Sensing Sensitivity: 1.2 mV
Zone Setting Status: 755011

## 2022-12-10 ENCOUNTER — Other Ambulatory Visit: Payer: Self-pay | Admitting: Cardiovascular Disease

## 2022-12-10 DIAGNOSIS — I480 Paroxysmal atrial fibrillation: Secondary | ICD-10-CM

## 2022-12-12 NOTE — Progress Notes (Signed)
Remote pacemaker transmission.   

## 2022-12-12 NOTE — Telephone Encounter (Signed)
Prescription refill request for Eliquis received. Indication: a fib Last office visit: 05/23/22 Scr: 1.09 02/07/22 kpn Age: 72 Weight: 97 kg

## 2022-12-14 ENCOUNTER — Telehealth: Payer: Self-pay | Admitting: *Deleted

## 2022-12-14 NOTE — Telephone Encounter (Signed)
CLEARANCE NOTES AND RECOMMENDATIONS WERE FAXED OVER BY EMILY MONGE, NP ON 10/26/22 @ 4:16.    I WILL RE-FAX NOTES AGAIN TODAY

## 2022-12-14 NOTE — Telephone Encounter (Signed)
CLEARANCE NOTES AND RECOMMENDATIONS WERE FAXED OVER BY EMILY MONGE, NP ON 10/26/22 @ 4:16.   I WILL RE-FAX NOTES AGAIN TODAY

## 2022-12-14 NOTE — Telephone Encounter (Signed)
[facility] called and said that they have not received medical clearance for patient to have surgery

## 2022-12-21 ENCOUNTER — Telehealth: Payer: Self-pay | Admitting: *Deleted

## 2022-12-21 ENCOUNTER — Telehealth: Payer: Self-pay

## 2022-12-21 NOTE — Telephone Encounter (Signed)
   Name: Nathan Harding  DOB: [birth year]  MRN: [id number]  Primary Cardiologist: Sanda Klein, MD   Preoperative team, please contact this patient and set up a phone call appointment for further preoperative risk assessment. Please obtain consent and complete medication review. Thank you for your help.  I confirm that guidance regarding antiplatelet and oral anticoagulation therapy has been completed and, if necessary, noted below.  Per Pharm D:  Patient with diagnosis of afib on Eliquis for anticoagulation.     Procedure: endoscopy and colonoscopy  Date of procedure: TBD   CHA2DS2-VASc Score = 4  This indicates a 4.8% annual risk of stroke. The patient's score is based upon: CHF History: 0 HTN History: 1 Diabetes History: 1 Stroke History: 0 Vascular Disease History: 1 Age Score: 1 Gender Score: 0   CrCl 92mL/min using adj body weight Platelet count 281K   Per office protocol, patient can hold Eliquis for 1-2 days prior to procedure   Mayra Reel, NP 12/21/2022, 4:53 PM [place]

## 2022-12-21 NOTE — Telephone Encounter (Signed)
Patient with diagnosis of afib on Eliquis for anticoagulation.    Procedure: endoscopy and colonoscopy  Date of procedure: TBD  CHA2DS2-VASc Score = 4  This indicates a 4.8% annual risk of stroke. The patient's score is based upon: CHF History: 0 HTN History: 1 Diabetes History: 1 Stroke History: 0 Vascular Disease History: 1 Age Score: 1 Gender Score: 0   CrCl 35mL/min using adj body weight Platelet count 281K  Per office protocol, patient can hold Eliquis for 1-2 days prior to procedure.    **This guidance is not considered finalized until pre-operative APP has relayed final recommendations.**

## 2022-12-21 NOTE — Telephone Encounter (Signed)
Spoke with patient and scheduled him for a preop telehealth appt on 12/28/22 at 2:20 PM. Consent in and meds reviewed. Will route to requesting surgeons office to make them aware.

## 2022-12-21 NOTE — Telephone Encounter (Signed)
   Pre-operative Risk Assessment    Patient Name: Nathan Harding  DOB: 19-Nov-1950 MRN: [id number]     Request for Surgical Clearance    Procedure:  endoscopy and colonoscopy   Date of Surgery:  Clearance TBD                                 Surgeon:  Not listed  Surgeon's Group or Practice Name:  [facility]  Phone number:  [phone] Fax number:  [phone]   Type of Clearance Requested:   - Pharmacy:  Hold Apixaban (Eliquis)     Type of Anesthesia:  Not Indicated   Additional requests/questions:    Oneal Grout   12/21/2022, 12:59 PM

## 2022-12-21 NOTE — Telephone Encounter (Signed)
  Patient Consent for Virtual Visit        Nathan Harding has provided verbal consent on 12/21/2022 for a virtual visit (video or telephone).   CONSENT FOR VIRTUAL VISIT FOR:  Nathan Harding  By participating in this virtual visit I agree to the following:  I hereby voluntarily request, consent and authorize [facility] and its employed or contracted physicians, physician assistants, nurse practitioners or other licensed health care professionals (the Practitioner), to provide me with telemedicine health care services (the "Services") as deemed necessary by the treating Practitioner. I acknowledge and consent to receive the Services by the Practitioner via telemedicine. I understand that the telemedicine visit will involve communicating with the Practitioner through live audiovisual communication technology and the disclosure of certain medical information by electronic transmission. I acknowledge that I have been given the opportunity to request an in-person assessment or other available alternative prior to the telemedicine visit and am voluntarily participating in the telemedicine visit.  I understand that I have the right to withhold or withdraw my consent to the use of telemedicine in the course of my care at any time, without affecting my right to future care or treatment, and that the Practitioner or I may terminate the telemedicine visit at any time. I understand that I have the right to inspect all information obtained and/or recorded in the course of the telemedicine visit and may receive copies of available information for a reasonable fee.  I understand that some of the potential risks of receiving the Services via telemedicine include:  Delay or interruption in medical evaluation due to technological equipment failure or disruption; Information transmitted may not be sufficient (e.g. poor resolution of images) to allow for appropriate medical decision making by the  Practitioner; and/or  In rare instances, security protocols could fail, causing a breach of personal health information.  Furthermore, I acknowledge that it is my responsibility to provide information about my medical history, conditions and care that is complete and accurate to the best of my ability. I acknowledge that Practitioner's advice, recommendations, and/or decision may be based on factors not within their control, such as incomplete or inaccurate data provided by me or distortions of diagnostic images or specimens that may result from electronic transmissions. I understand that the practice of medicine is not an exact science and that Practitioner makes no warranties or guarantees regarding treatment outcomes. I acknowledge that a copy of this consent can be made available to me via my patient portal ([facility]), or I can request a printed copy by calling the office of [facility].    I understand that my insurance will be billed for this visit.   I have read or had this consent read to me. I understand the contents of this consent, which adequately explains the benefits and risks of the Services being provided via telemedicine.  I have been provided ample opportunity to ask questions regarding this consent and the Services and have had my questions answered to my satisfaction. I give my informed consent for the services to be provided through the use of telemedicine in my medical care

## 2022-12-22 NOTE — Telephone Encounter (Signed)
Placed call back to [place] @ [facility], Had to leave a detailed message that we have not received a clearance for pt's Pacemaker and she has been provided the fax # to send it to [phone].

## 2022-12-22 NOTE — Telephone Encounter (Signed)
[facility] is calling to have the clearance for the pacemaker faxed, as well. She states she only received the medical clearance.

## 2022-12-26 NOTE — Progress Notes (Unsigned)
Virtual Visit via Telephone Note   Because of Nathan Harding's co-morbid illnesses, he is at least at moderate risk for complications without adequate follow up.  This format is felt to be most appropriate for this patient at this time.  The patient did not have access to video technology/had technical difficulties with video requiring transitioning to audio format only (telephone).  All issues noted in this document were discussed and addressed.  No physical exam could be performed with this format.  Please refer to the patient's chart for his consent to telehealth for [facility].  Evaluation Performed:  Preoperative cardiovascular risk assessment _____________   Date:  12/28/2022   Patient ID:  Nathan Harding, DOB 07-14-1951, MRN [id number] Patient Location:  Home Provider location:   Office  Primary Care Provider:  Wynonia Hazard, MD Primary Cardiologist:  Sanda Klein, MD  Chief Complaint / Patient Profile   72 y.o. 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  History of Present Illness    Nathan Harding is a 72 y.o. male who presents via audio/video conferencing for a telehealth visit today.  Pt was last seen in cardiology clinic on 05/23/22 by Dr. Sallyanne Kuster  At that time Nathan Harding was doing well.  The patient is now pending procedure as outlined above. Since his last visit, he denies chest pain, shortness of breath, lower extremity edema, fatigue, palpitations, melena, hematuria, hemoptysis, diaphoresis, weakness, presyncope, syncope, orthopnea, and PND. He is currently using a cane due  to thigh pain that orthopedic could not determine cause of. He remains active with walking up and down stairs multiple times per day, moving furniture, and doing yard and house work.   Past Medical History    Past Medical History:  Diagnosis Date   Allergy    Anxiety    Arthritis    Cancer ([place])    skin- Basal Cell CA   Cardiac pacemaker    Carotid sinus syndrome with bradycardia    Depression    Diabetes mellitus    ED (erectile dysfunction)    GERD (gastroesophageal reflux disease)    Hyperlipidemia    Hypertension    Male urinary stress incontinence    Prostate cancer ([place]) 2013   Right renal mass    Severe sinus bradycardia    Sleep apnea    uses CPAP   Ulcer    Past Surgical History:  Procedure Laterality Date   COLONOSCOPY     PACEMAKER INSERTION  03/31/11   due to hypersensitive carotid sinus syndrome   PENILE PROSTHESIS IMPLANT     POLYPECTOMY     PPM GENERATOR CHANGEOUT N/A 02/07/2022   Procedure: PPM GENERATOR CHANGEOUT;  Surgeon: Sanda Klein, MD;  Location: [facility];  Service: Cardiovascular;  Laterality: N/A;   PROSTATECTOMY     ROTATOR CUFF REPAIR  2010   right   SHOULDER SURGERY  2002   right   temporal mastoidectomy  2009   behind left ear    Allergies  Allergies  Allergen Reactions   Bactrim [Sulfamethoxazole-Trimethoprim] Rash    Dizziness     Home Medications    Prior to Admission medications   Medication  Sig Start Date End Date Taking? Authorizing Provider  acetaminophen (TYLENOL) 500 MG tablet Take 1,000 mg by mouth daily as needed for moderate pain.    [provider]  apixaban (ELIQUIS) 5 MG TABS tablet TAKE 1 TABLET(5 MG) BY MOUTH TWICE DAILY 12/12/22   Croitoru, Dani Gobble, MD  carvedilol (COREG) 6.25 MG tablet Take 6.25 mg by mouth 2 (two) times daily with a meal.      [provider]  cyclobenzaprine (FLEXERIL) 10 MG tablet Take 10 mg by mouth 3 (three) times daily as needed for muscle spasms. Patient not  taking: Reported on 05/23/2022 12/21/21   [provider]  famotidine (PEPCID) 40 MG tablet Take 40 mg by mouth at bedtime. 01/30/19   [provider]  fenofibrate (TRICOR) 145 MG tablet Take 1 tablet (145 mg total) by mouth daily. Patient not taking: Reported on 05/23/2022 10/15/21   Croitoru, Mihai, MD  glimepiride (AMARYL) 4 MG tablet Take 4 mg by mouth daily with breakfast.    [provider]  lisinopril (ZESTRIL) 10 MG tablet Take 1 tablet (10 mg total) by mouth daily. 01/14/22   Croitoru, Mihai, MD  metFORMIN (GLUCOPHAGE-XR) 500 MG 24 hr tablet Take 2,000 mg by mouth daily. 02/19/15   [provider]  PARoxetine (PAXIL) 20 MG tablet Take 20 mg by mouth daily.    [provider]  rOPINIRole (REQUIP) 0.5 MG tablet Take 0.5 mg by mouth every evening. 03/02/22   [provider]  rosuvastatin (CRESTOR) 20 MG tablet Take 1 tablet (20 mg total) by mouth daily. NEED OV. Patient taking differently: Take 20 mg by mouth at bedtime. NEED OV. 09/18/17   Croitoru, Mihai, MD  ramipril (ALTACE) 5 MG capsule Take 5 mg by mouth 2 (two) times daily.    12/13/11  [provider]    Physical Exam    Vital Signs:  Nathan Harding does not have vital signs available for review today.  Given telephonic nature of communication, physical exam is limited. AAOx3. NAD. Normal affect.  Speech and respirations are unlabored.  Accessory Clinical Findings    None  Assessment & Plan    1.  Preoperative Cardiovascular Risk Assessment: According to the Revised Cardiac Risk Index (RCRI), his Perioperative Risk of Major Cardiac Event is (%): 0.4. His Functional Capacity in METs is: 6.05 according to the Duke Activity Status Index (DASI). The patient is doing well from a cardiac perspective. Therefore, based on ACC/AHA guidelines, the patient would be at acceptable risk for the planned procedure without further cardiovascular testing.   The patient was advised that  if he develops new symptoms prior to surgery to contact our office to arrange for a follow-up visit, and he verbalized understanding.  Per office protocol, patient can hold Eliquis for 1-2 days prior to procedure.    A copy of this note will be routed to requesting surgeon.  Time:   Today, I have spent 10 minutes with the patient with telehealth technology discussing medical history, symptoms, and management plan.     Emmaline Life, NP-C  12/28/2022, 2:20 PM [address], [address] Office [phone] Fax [phone]

## 2022-12-28 ENCOUNTER — Encounter: Payer: Self-pay | Admitting: Nurse Practitioner

## 2022-12-28 ENCOUNTER — Ambulatory Visit: Payer: Medicare Other | Attending: Internal Medicine | Admitting: Nurse Practitioner

## 2022-12-28 DIAGNOSIS — Z01810 Encounter for preprocedural cardiovascular examination: Secondary | ICD-10-CM | POA: Diagnosis not present

## 2023-01-02 ENCOUNTER — Telehealth: Payer: Self-pay | Admitting: Emergency Medicine

## 2023-01-02 NOTE — Telephone Encounter (Signed)
Fax sent to [facility] preoperative pacemaker/defibrillator Information along with Medical Clearance- phone visit and Last office visit with Dr Sallyanne Kuster

## 2023-01-09 ENCOUNTER — Other Ambulatory Visit: Payer: Self-pay | Admitting: Cardiovascular Disease

## 2023-02-07 ENCOUNTER — Ambulatory Visit (INDEPENDENT_AMBULATORY_CARE_PROVIDER_SITE_OTHER): Payer: Medicare Other

## 2023-02-07 DIAGNOSIS — I4729 Other ventricular tachycardia: Secondary | ICD-10-CM

## 2023-02-07 LAB — CUP PACEART REMOTE DEVICE CHECK
Battery Remaining Longevity: 155 mo
Battery Voltage: 3.07 V
Brady Statistic AP VP Percent: 8.58 %
Brady Statistic AP VS Percent: 0.01 %
Brady Statistic AS VP Percent: 21.07 %
Brady Statistic AS VS Percent: 70.34 %
Brady Statistic RA Percent Paced: 8.6 %
Brady Statistic RV Percent Paced: 29.65 %
Date Time Interrogation Session: 20240506224140
Implantable Lead Connection Status: 753985
Implantable Lead Connection Status: 753985
Implantable Lead Implant Date: 20120628
Implantable Lead Implant Date: 20120628
Implantable Lead Location: 753859
Implantable Lead Location: 753860
Implantable Pulse Generator Implant Date: 20230508
Lead Channel Impedance Value: 323 Ohm
Lead Channel Impedance Value: 380 Ohm
Lead Channel Impedance Value: 399 Ohm
Lead Channel Impedance Value: 437 Ohm
Lead Channel Pacing Threshold Amplitude: 0.5 V
Lead Channel Pacing Threshold Amplitude: 1 V
Lead Channel Pacing Threshold Pulse Width: 0.4 ms
Lead Channel Pacing Threshold Pulse Width: 0.4 ms
Lead Channel Sensing Intrinsic Amplitude: 2.625 mV
Lead Channel Sensing Intrinsic Amplitude: 2.625 mV
Lead Channel Sensing Intrinsic Amplitude: 3.25 mV
Lead Channel Sensing Intrinsic Amplitude: 3.25 mV
Lead Channel Setting Pacing Amplitude: 1.5 V
Lead Channel Setting Pacing Amplitude: 2.5 V
Lead Channel Setting Pacing Pulse Width: 0.4 ms
Lead Channel Setting Sensing Sensitivity: 1.2 mV
Zone Setting Status: 755011

## 2023-03-01 NOTE — Progress Notes (Signed)
Remote pacemaker transmission.   

## 2023-03-10 ENCOUNTER — Other Ambulatory Visit: Payer: Self-pay

## 2023-03-10 DIAGNOSIS — I480 Paroxysmal atrial fibrillation: Secondary | ICD-10-CM

## 2023-03-10 MED ORDER — APIXABAN 5 MG PO TABS: ORAL_TABLET | ORAL | 1 refills | Status: DC

## 2023-03-10 NOTE — Telephone Encounter (Signed)
Prescription refill request for Eliquis received. Indication:afib Last office visit:3/24 Scr:1.09 2023 Age: 72 Weight:97.7  kg  Prescription refilled

## 2023-05-03 ENCOUNTER — Telehealth: Payer: Self-pay | Admitting: Cardiovascular Disease

## 2023-05-03 DIAGNOSIS — I480 Paroxysmal atrial fibrillation: Secondary | ICD-10-CM

## 2023-05-03 NOTE — Telephone Encounter (Signed)
*  STAT* If patient is at the pharmacy, call can be transferred to refill team.   1. Which medications need to be refilled? (please list name of each medication and dose if known) apixaban (ELIQUIS) 5 MG TABS tablet   2. Which pharmacy/location (including street and city if local pharmacy) is medication to be sent to?  [facility] - [address], [place] - [address] [address]      3. Do they need a 30 day or 90 day supply? 90 day

## 2023-05-04 MED ORDER — APIXABAN 5 MG PO TABS
ORAL_TABLET | ORAL | 1 refills | Status: DC
Start: 2023-05-04 — End: 2023-10-20

## 2023-05-04 NOTE — Telephone Encounter (Signed)
Prescription refill request for Eliquis received. Indication: Afib  Last office visit: 05/23/22 (Croitoru)  Scr: 1.60 (05/01/23)  Age: 72 Weight: 97.7kg  Appropriate dose. Refill sent.

## 2023-05-09 ENCOUNTER — Ambulatory Visit (INDEPENDENT_AMBULATORY_CARE_PROVIDER_SITE_OTHER): Payer: Medicare Other

## 2023-05-09 DIAGNOSIS — I4719 Other supraventricular tachycardia: Secondary | ICD-10-CM | POA: Diagnosis not present

## 2023-05-16 ENCOUNTER — Telehealth: Payer: Self-pay | Admitting: Cardiovascular Disease

## 2023-05-16 NOTE — Telephone Encounter (Signed)
[facility] is calling to get last office notes for patient. Requesting sent to following fax #:    Fax # [phone]

## 2023-05-16 NOTE — Telephone Encounter (Signed)
Spoke with Nathan Harding and they are requesting last office visit notes to close out patient's referral. Last visit is a phone visit. Faxed over notes

## 2023-05-25 NOTE — Progress Notes (Signed)
Remote pacemaker transmission.   

## 2023-06-15 ENCOUNTER — Encounter: Payer: Medicare Other | Admitting: Cardiovascular Disease

## 2023-06-16 ENCOUNTER — Telehealth: Payer: Self-pay | Admitting: Cardiovascular Disease

## 2023-06-16 ENCOUNTER — Encounter: Payer: Self-pay | Admitting: Diagnostic Radiology

## 2023-06-16 NOTE — Telephone Encounter (Signed)
He is not pacemaker dependent. Rarely needs pacing.

## 2023-06-16 NOTE — Telephone Encounter (Signed)
They are asking level of dependence on his pacemaker.  Starting treatment scanning process. The will set plan to start his treatment soon.   Goal is to avoid pacemaker and that area but to be sure provider aware.   TX to ribs sternum and left sternum.   If information needs to be faxed it can be to Fax number [phone]

## 2023-06-16 NOTE — Telephone Encounter (Signed)
[facility] ([facility] cancer [facility]) called in stating is going to be starting radiation treatment and they need to know the level of dependency he has on his pacemaker. Please advise.

## 2023-06-16 NOTE — Telephone Encounter (Signed)
Spoke with Duwayne Heck and gave information. She states understanding . They will prepare for his treatments

## 2023-06-16 NOTE — Telephone Encounter (Signed)
Called back and LM to call office.

## 2023-06-16 NOTE — Telephone Encounter (Signed)
error 

## 2023-06-16 NOTE — Telephone Encounter (Signed)
Call to [facility] and LM to call me personally in Triage

## 2023-08-08 ENCOUNTER — Ambulatory Visit (INDEPENDENT_AMBULATORY_CARE_PROVIDER_SITE_OTHER): Payer: Medicare Other

## 2023-08-08 DIAGNOSIS — I4719 Other supraventricular tachycardia: Secondary | ICD-10-CM | POA: Diagnosis not present

## 2023-08-09 LAB — CUP PACEART REMOTE DEVICE CHECK
Battery Remaining Longevity: 142 mo
Battery Voltage: 3.03 V
Brady Statistic AP VP Percent: 6.34 %
Brady Statistic AP VS Percent: 0.03 %
Brady Statistic AS VP Percent: 8.46 %
Brady Statistic AS VS Percent: 85.18 %
Brady Statistic RA Percent Paced: 6.36 %
Brady Statistic RV Percent Paced: 14.8 %
Date Time Interrogation Session: 20241105084631
Implantable Lead Connection Status: 753985
Implantable Lead Connection Status: 753985
Implantable Lead Implant Date: 20120628
Implantable Lead Implant Date: 20120628
Implantable Lead Location: 753859
Implantable Lead Location: 753860
Implantable Pulse Generator Implant Date: 20230508
Lead Channel Impedance Value: 285 Ohm
Lead Channel Impedance Value: 323 Ohm
Lead Channel Impedance Value: 361 Ohm
Lead Channel Impedance Value: 380 Ohm
Lead Channel Pacing Threshold Amplitude: 0.5 V
Lead Channel Pacing Threshold Amplitude: 1.5 V
Lead Channel Pacing Threshold Pulse Width: 0.4 ms
Lead Channel Pacing Threshold Pulse Width: 0.4 ms
Lead Channel Sensing Intrinsic Amplitude: 3 mV
Lead Channel Sensing Intrinsic Amplitude: 3 mV
Lead Channel Sensing Intrinsic Amplitude: 4 mV
Lead Channel Sensing Intrinsic Amplitude: 4 mV
Lead Channel Setting Pacing Amplitude: 1.5 V
Lead Channel Setting Pacing Amplitude: 3 V
Lead Channel Setting Pacing Pulse Width: 0.4 ms
Lead Channel Setting Sensing Sensitivity: 1.2 mV
Zone Setting Status: 755011

## 2023-09-05 NOTE — Progress Notes (Signed)
Remote pacemaker transmission.   

## 2023-09-07 ENCOUNTER — Ambulatory Visit: Payer: Medicare Other | Attending: Cardiovascular Disease | Admitting: Cardiovascular Disease

## 2023-09-07 ENCOUNTER — Encounter: Payer: Self-pay | Admitting: Cardiovascular Disease

## 2023-09-07 VITALS — BP 134/86 | HR 79 | Ht 70.0 in | Wt 200.0 lb

## 2023-09-07 DIAGNOSIS — I2510 Atherosclerotic heart disease of native coronary artery without angina pectoris: Secondary | ICD-10-CM | POA: Diagnosis not present

## 2023-09-07 DIAGNOSIS — E119 Type 2 diabetes mellitus without complications: Secondary | ICD-10-CM

## 2023-09-07 DIAGNOSIS — I480 Paroxysmal atrial fibrillation: Secondary | ICD-10-CM

## 2023-09-07 DIAGNOSIS — G9001 Carotid sinus syncope: Secondary | ICD-10-CM

## 2023-09-07 DIAGNOSIS — I10 Essential (primary) hypertension: Secondary | ICD-10-CM

## 2023-09-07 DIAGNOSIS — D6869 Other thrombophilia: Secondary | ICD-10-CM

## 2023-09-07 DIAGNOSIS — C9000 Multiple myeloma not having achieved remission: Secondary | ICD-10-CM

## 2023-09-07 DIAGNOSIS — Z950 Presence of cardiac pacemaker: Secondary | ICD-10-CM

## 2023-09-07 DIAGNOSIS — E782 Mixed hyperlipidemia: Secondary | ICD-10-CM

## 2023-09-07 DIAGNOSIS — I4719 Other supraventricular tachycardia: Secondary | ICD-10-CM

## 2023-09-07 MED ORDER — LISINOPRIL 5 MG PO TABS: 5.0000 mg | ORAL_TABLET | Freq: Every day | ORAL | 3 refills | Status: DC

## 2023-09-07 NOTE — Progress Notes (Signed)
Cardiology office note   Date:  09/09/2023   ID:  KAICEN MCCUNE, DOB 1951-02-17, MRN [id number]  PCP:  Macario Golds, MD  Cardiologist:  Odysseus Cada Electrophysiologist:  None   Evaluation Performed:  Follow-Up Visit  Chief Complaint: Pacemaker follow-up  History of Present Illness:    Nathan Harding is a 72 y.o. 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  He underwent pacemaker generator change out 02/07/2022 (Medtronic Azure, original 5086 leads from 2012, system is MRI conditional).  Nathan Harding has not had any cardiovascular problems, but has had a difficult year from a health point of view.  In June he had symptomatic hypocalcemia and a lot of pain in his left leg.  He was eventually diagnosed with multiple myeloma.  He had a rod implanted in his left femur prophylactically.  He started treatment with chemotherapy and has developed recurrent shingles in his left flank with substantial neuralgia.  He is occasionally dizzy and often at his oncology visits his blood pressure has been relatively low, even though today it was 134/86.  He has not had actual falls or syncope.  He denies chest pain or dyspnea at rest or with activity, but he has been much more sedentary due to his leg pain primarily.  He has not had palpitations, orthopnea, PND, leg edema.  [place] in [place] and is able to perform fairly intense physical labor without complaints.    His hematologist is Dr. Molli Knock in [place].  He is on treatment with Velcade, dexamethasone and Revlimid.  Reviewed labs from [facility] from 09/06/2023 he  has mild anemia with a hemoglobin of 11.4.  His platelets had dropped to 99K, but have rebounded to 154K.  Calcium is normal at 8.9.  Creatinine is 1.4 and potassium is 4.6.  Liver function tests are normal.  Free kappa light chains are elevated at 117 and the kappa/lambda ratio is 42 (improved from a peak of 1214 with a ratio of 377).  He is compliant with CPAP and he denies daytime hypersomnolence.  His artificial urinary bladder sphincter has had some dysfunction and he may need to have a repeat operation.  Hemoglobin A1c was 8.5% in January and has not been rechecked since then.  In April 2024 lipid profile showed an excellent LDL cholesterol but with borderline low HDL and borderline elevated triglycerides.  He is no longer taking fenofibrate but remains on statin.  Pacemaker interrogation shows normal device function.  He only has about 8% atrial pacing and 23% ventricular pacing.  He had a single episode of atrial fibrillation lasting for about 40 minutes that occurred on 07/30/2023 with an overall prevalence of A-fib that is well under 0.1%.  Estimated generator longevity is 12 years and all the lead parameters are normal range.  He has frequent interventions for "rate drop".  Activity level is very low, while under an hour a day.  He has had some problems with the app on his phone which keeps shutting itself down, but we have been getting his downloads regularly.   Past Medical History:  Diagnosis Date   Allergy    Anxiety    Arthritis    Cancer (HCC)  skin- Basal Cell CA   Cardiac pacemaker    Carotid sinus syndrome with bradycardia    Depression    Diabetes mellitus    ED (erectile dysfunction)    GERD (gastroesophageal reflux disease)    Hyperlipidemia    Hypertension    Male urinary stress incontinence    Prostate cancer (HCC) 2013   Right renal mass    Severe sinus bradycardia    Sleep apnea    uses CPAP   Ulcer    Past Surgical History:  Procedure Laterality Date    COLONOSCOPY     PACEMAKER INSERTION  03/31/11   due to hypersensitive carotid sinus syndrome   PENILE PROSTHESIS IMPLANT     POLYPECTOMY     PPM GENERATOR CHANGEOUT N/A 02/07/2022   Procedure: PPM GENERATOR CHANGEOUT;  Surgeon: Thurmon Fair, MD;  Location: [facility] INVASIVE CV LAB;  Service: Cardiovascular;  Laterality: N/A;   PROSTATECTOMY     ROTATOR CUFF REPAIR  2010   right   SHOULDER SURGERY  2002   right   temporal mastoidectomy  2009   behind left ear     Current Meds  Medication Sig   acyclovir (ZOVIRAX) 400 MG tablet Take 400 mg by mouth 2 (two) times daily.   apixaban (ELIQUIS) 5 MG TABS tablet TAKE 1 TABLET(5 MG) BY MOUTH TWICE DAILY   carvedilol (COREG) 6.25 MG tablet Take 6.25 mg by mouth 2 (two) times daily with a meal.     dexamethasone (DECADRON) 4 MG tablet Take 4 mg by mouth once a week. Five tablets after treatments   famotidine (PEPCID) 40 MG tablet Take 40 mg by mouth at bedtime.   gabapentin (NEURONTIN) 300 MG capsule Take 300 mg by mouth 3 (three) times daily.   glimepiride (AMARYL) 4 MG tablet Take 4 mg by mouth daily with breakfast.   lenalidomide (REVLIMID) 10 MG capsule Take 10 mg by mouth daily.   metFORMIN (GLUCOPHAGE-XR) 500 MG 24 hr tablet Take 2,000 mg by mouth daily.   PARoxetine (PAXIL) 20 MG tablet Take 20 mg by mouth daily.   rOPINIRole (REQUIP) 0.5 MG tablet Take 0.5 mg by mouth every evening.   rosuvastatin (CRESTOR) 20 MG tablet Take 1 tablet (20 mg total) by mouth daily. NEED OV. (Patient taking differently: Take 20 mg by mouth at bedtime. NEED OV.)   triamcinolone cream (KENALOG) 0.1 % Apply 1 Application topically 2 (two) times daily.   [DISCONTINUED] lisinopril (ZESTRIL) 10 MG tablet TAKE 1 TABLET(10 MG) BY MOUTH DAILY     Allergies:   Bactrim [sulfamethoxazole-trimethoprim]   Social History   Tobacco Use   Smoking status: Former   Smokeless tobacco: Never  Substance Use Topics   Alcohol use: Yes    Alcohol/week: 6.0 standard drinks of  alcohol    Types: 6 Cans of beer per week    Comment: no drank in one month  -  updated 12/12/11   update 1-6 pack every 2 weeks   Drug use: No     Family Hx: The patient's family history includes Cancer in his mother; Diabetes in his father; Esophageal cancer in his cousin; Hypertension in his brother and brother. There is no history of Colon cancer, Stomach cancer, or Rectal cancer.  ROS:   Please see the history of present illness.  All other systems are reviewed and are negative.  Prior CV studies:   The following studies were reviewed today:  Comprehensive pacemaker check in office Multiple labs, pathology tests and  imaging studies from [facility]  Labs/Other Tests and Data Reviewed:    EKG:    EKG Interpretation Date/Time:  Thursday September 07 2023 10:15:55 EST Ventricular Rate:  79 PR Interval:  208 QRS Duration:  82 QT Interval:  362 QTC Calculation: 415 R Axis:   -6  Text Interpretation: Atrial-sensed ventricular-paced rhythm When compared with ECG of 07-Feb-2022 13:19, Electronic ventricular pacemaker has replaced Sinus rhythm Confirmed by Abass Misener [phone]) on 09/07/2023 10:20:13 AM         Recent Labs: No results found for requested labs within last 365 days.   Recent Lipid Panel Lab Results  Component Value Date/Time   CHOL 158 02/16/2016 10:58 AM   TRIG 163 (H) 02/16/2016 10:58 AM   HDL 58 02/16/2016 10:58 AM   CHOLHDL 2.7 02/16/2016 10:58 AM   LDLCALC 67 02/16/2016 10:58 AM   01/27/2023 Cholesterol mg/dL 191 L   Triglycerides mg/dL 478 H   HDL Cholesterol mg/dL 38 L   VLDL Cholesterol Cal mg/dL 27   LDL Chol Calc (NIH) mg/dL 29   LDL/HDL Ratio ratio 0.8     Wt Readings from Last 3 Encounters:  09/07/23 200 lb (90.7 kg)  05/23/22 215 lb 6.4 oz (97.7 kg)  02/07/22 210 lb (95.3 kg)     Objective:    Vital Signs:  BP 134/86 (BP Location: Left Arm, Patient Position: Sitting, Cuff Size: Normal)   Pulse 79   Ht 5\' 10"  (1.778 m)   Wt 200  lb (90.7 kg)   SpO2 96%   BMI 28.70 kg/m     General: Alert, oriented x3, no distress, well-healed pacemaker site.  Mildly obese Head: no evidence of trauma, PERRL, EOMI, no exophtalmos or lid lag, no myxedema, no xanthelasma; normal ears, nose and oropharynx Neck: normal jugular venous pulsations and no hepatojugular reflux; brisk carotid pulses without delay and no carotid bruits Chest: clear to auscultation, no signs of consolidation by percussion or palpation, normal fremitus, symmetrical and full respiratory excursions Cardiovascular: normal position and quality of the apical impulse, regular rhythm, normal first and second heart sounds, no murmurs, rubs or gallops Abdomen: no tenderness or distention, no masses by palpation, no abnormal pulsatility or arterial bruits, normal bowel sounds, no hepatosplenomegaly Extremities: no clubbing, cyanosis or edema; 2+ radial, ulnar and brachial pulses bilaterally; 2+ right femoral, posterior tibial and dorsalis pedis pulses; 2+ left femoral, posterior tibial and dorsalis pedis pulses; no subclavian or femoral bruits Neurological: grossly nonfocal Psych: Normal mood and affect    ASSESSMENT & PLAN:    AFib: Very low prevalence.  CHADSVasc 3-4 (age, HTN, DM, +/- minor CAD).  Compliant with Eliquis. Eliquis: Well-tolerated, no bleeding complications. HTN: Well-controlled.  In fact, he is having some symptoms of orthostatic hypotension.  To decrease the lisinopril to 5 mg daily. HLP: LDL cholesterol was within target range.  He has borderline elevated triglycerides but he should not be restarted on fenofibrate.  Borderline low HDL cholesterol as well.  Findings consistent with known diagnosis of insulin resistance/type 2 diabetes mellitus.  Ideally he should try to lose some weight and being more physically active, hopefully he will be able to do this as his multiple myeloma condition improves. DM: Poorly controlled, worsened by treatment with steroids  for myeloma. NSVT: Occasional recorded by his device in the past, none recently. CAD: Asymptomatic.  Minor nonobstructive LAD atherosclerosis seen at time of remote cardiac catheterization 2010. Carotid sinus hypersensitivity: He has not had any syncope since the pacemaker  was implanted. Pacemaker: Normal device function.  Continue downloads every 3 months.  He has had some issues with the app, but we have been getting his downloads. Multiple myeloma: The bortezomib and Revlimid increases risk of cardiac arrhythmia, congestive heart failure and thromboembolic events, but so far there has been no evidence of any adverse impacts.  He is on chronic anticoagulation with Eliquis anyway.  He has not had an evaluation of left ventricular function in quite a while.  Recommend an echocardiogram in the next few months.      Medication Adjustments/Labs and Tests Ordered: Current medicines are reviewed at length with the patient today.  Concerns regarding medicines are outlined above.   Tests Ordered: Orders Placed This Encounter  Procedures   EKG 12-Lead   ECHOCARDIOGRAM COMPLETE    Medication Changes: Meds ordered this encounter  Medications   lisinopril (ZESTRIL) 5 MG tablet    Sig: Take 1 tablet (5 mg total) by mouth daily.    Dispense:  90 tablet    Refill:  3    Disposition:  Follow up  12 months  Signed, Thurmon Fair, MD  09/09/2023 11:56 AM    [facility]

## 2023-09-07 NOTE — Patient Instructions (Signed)
 Medication Instructions:  Decrease Lisinopril to 5 mg daily *If you need a refill on your cardiac medications before your next appointment, please call your pharmacy*  Follow-Up: At [facility], you and your health needs are our priority.  As part of our continuing mission to provide you with exceptional heart care, we have created designated Provider Care Teams.  These Care Teams include your primary Cardiologist (physician) and Advanced Practice Providers (APPs -  Physician Assistants and Nurse Practitioners) who all work together to provide you with the care you need, when you need it.  We recommend signing up for the patient portal called "MyChart".  Sign up information is provided on this After Visit Summary.  MyChart is used to connect with patients for Virtual Visits (Telemedicine).  Patients are able to view lab/test results, encounter notes, upcoming appointments, etc.  Non-urgent messages can be sent to your provider as well.   To learn more about what you can do with MyChart, go to [email or web address].    Your next appointment:   1 year(s)  Provider:   Thurmon Fair, MD

## 2023-09-09 ENCOUNTER — Encounter: Payer: Self-pay | Admitting: Cardiovascular Disease

## 2023-10-13 ENCOUNTER — Other Ambulatory Visit (HOSPITAL_COMMUNITY): Payer: Medicare Other

## 2023-10-20 ENCOUNTER — Other Ambulatory Visit: Payer: Self-pay | Admitting: *Deleted

## 2023-10-20 DIAGNOSIS — I480 Paroxysmal atrial fibrillation: Secondary | ICD-10-CM

## 2023-10-20 MED ORDER — APIXABAN 5 MG PO TABS: ORAL_TABLET | ORAL | 1 refills | Status: DC

## 2023-10-20 NOTE — Telephone Encounter (Signed)
Eliquis 5mg  refill request received. Patient is 73 years old, weight-90.7kg, Crea-1.40 on 10/19/23 via [facility], Diagnosis-Afib, and last seen by Dr. Royann Shivers on 09/07/23. Dose is appropriate based on dosing criteria. Will send in refill to requested pharmacy.

## 2023-10-30 ENCOUNTER — Ambulatory Visit (HOSPITAL_COMMUNITY): Payer: Medicare Other

## 2023-10-30 ENCOUNTER — Encounter (HOSPITAL_COMMUNITY): Payer: Self-pay

## 2023-11-07 ENCOUNTER — Ambulatory Visit (INDEPENDENT_AMBULATORY_CARE_PROVIDER_SITE_OTHER): Payer: Medicare Other

## 2023-11-07 DIAGNOSIS — I480 Paroxysmal atrial fibrillation: Secondary | ICD-10-CM

## 2023-11-08 LAB — CUP PACEART REMOTE DEVICE CHECK
Battery Remaining Longevity: 147 mo
Battery Voltage: 3.02 V
Brady Statistic AP VP Percent: 4.37 %
Brady Statistic AP VS Percent: 0.01 %
Brady Statistic AS VP Percent: 24.19 %
Brady Statistic AS VS Percent: 71.43 %
Brady Statistic RA Percent Paced: 4.38 %
Brady Statistic RV Percent Paced: 28.56 %
Date Time Interrogation Session: 20250203204604
Implantable Lead Connection Status: 753985
Implantable Lead Connection Status: 753985
Implantable Lead Implant Date: 20120628
Implantable Lead Implant Date: 20120628
Implantable Lead Location: 753859
Implantable Lead Location: 753860
Implantable Pulse Generator Implant Date: 20230508
Lead Channel Impedance Value: 285 Ohm
Lead Channel Impedance Value: 304 Ohm
Lead Channel Impedance Value: 342 Ohm
Lead Channel Impedance Value: 361 Ohm
Lead Channel Pacing Threshold Amplitude: 0.5 V
Lead Channel Pacing Threshold Amplitude: 1.125 V
Lead Channel Pacing Threshold Pulse Width: 0.4 ms
Lead Channel Pacing Threshold Pulse Width: 0.4 ms
Lead Channel Sensing Intrinsic Amplitude: 2.875 mV
Lead Channel Sensing Intrinsic Amplitude: 2.875 mV
Lead Channel Sensing Intrinsic Amplitude: 3.25 mV
Lead Channel Sensing Intrinsic Amplitude: 3.25 mV
Lead Channel Setting Pacing Amplitude: 1.5 V
Lead Channel Setting Pacing Amplitude: 2.25 V
Lead Channel Setting Pacing Pulse Width: 0.4 ms
Lead Channel Setting Sensing Sensitivity: 1.2 mV
Zone Setting Status: 755011

## 2023-11-11 ENCOUNTER — Encounter: Payer: Self-pay | Admitting: Cardiovascular Disease

## 2023-11-24 ENCOUNTER — Ambulatory Visit (HOSPITAL_COMMUNITY): Payer: Medicare Other

## 2023-12-08 ENCOUNTER — Other Ambulatory Visit (HOSPITAL_COMMUNITY): Payer: Medicare Other

## 2023-12-11 ENCOUNTER — Ambulatory Visit (HOSPITAL_COMMUNITY): Payer: Medicare Other | Attending: Cardiovascular Disease

## 2023-12-11 ENCOUNTER — Encounter: Payer: Self-pay | Admitting: Cardiovascular Disease

## 2023-12-11 DIAGNOSIS — I480 Paroxysmal atrial fibrillation: Secondary | ICD-10-CM | POA: Diagnosis present

## 2023-12-11 LAB — ECHOCARDIOGRAM COMPLETE
Area-P 1/2: 2.5 cm2
S' Lateral: 3.3 cm

## 2023-12-14 NOTE — Progress Notes (Signed)
 Remote pacemaker transmission.

## 2023-12-14 NOTE — Addendum Note (Signed)
 Addended by: Geralyn Flash D on: 12/14/2023 02:57 PM   Modules accepted: Orders

## 2024-02-06 ENCOUNTER — Ambulatory Visit (INDEPENDENT_AMBULATORY_CARE_PROVIDER_SITE_OTHER): Payer: Medicare Other

## 2024-02-06 DIAGNOSIS — I480 Paroxysmal atrial fibrillation: Secondary | ICD-10-CM | POA: Diagnosis not present

## 2024-02-06 LAB — CUP PACEART REMOTE DEVICE CHECK
Battery Remaining Longevity: 141 mo
Battery Voltage: 3.02 V
Brady Statistic AP VP Percent: 4.56 %
Brady Statistic AP VS Percent: 0.01 %
Brady Statistic AS VP Percent: 38.65 %
Brady Statistic AS VS Percent: 56.78 %
Brady Statistic RA Percent Paced: 4.57 %
Brady Statistic RV Percent Paced: 43.19 %
Date Time Interrogation Session: 20250505231408
Implantable Lead Connection Status: 753985
Implantable Lead Connection Status: 753985
Implantable Lead Implant Date: 20120628
Implantable Lead Implant Date: 20120628
Implantable Lead Location: 753859
Implantable Lead Location: 753860
Implantable Pulse Generator Implant Date: 20230508
Lead Channel Impedance Value: 304 Ohm
Lead Channel Impedance Value: 323 Ohm
Lead Channel Impedance Value: 361 Ohm
Lead Channel Impedance Value: 380 Ohm
Lead Channel Pacing Threshold Amplitude: 0.625 V
Lead Channel Pacing Threshold Amplitude: 1 V
Lead Channel Pacing Threshold Pulse Width: 0.4 ms
Lead Channel Pacing Threshold Pulse Width: 0.4 ms
Lead Channel Sensing Intrinsic Amplitude: 3.75 mV
Lead Channel Sensing Intrinsic Amplitude: 3.75 mV
Lead Channel Sensing Intrinsic Amplitude: 4.25 mV
Lead Channel Sensing Intrinsic Amplitude: 4.25 mV
Lead Channel Setting Pacing Amplitude: 1.5 V
Lead Channel Setting Pacing Amplitude: 2.5 V
Lead Channel Setting Pacing Pulse Width: 0.4 ms
Lead Channel Setting Sensing Sensitivity: 1.2 mV
Zone Setting Status: 755011

## 2024-02-11 ENCOUNTER — Encounter: Payer: Self-pay | Admitting: Cardiovascular Disease

## 2024-03-21 NOTE — Progress Notes (Signed)
 Remote pacemaker transmission.

## 2024-05-07 ENCOUNTER — Ambulatory Visit (INDEPENDENT_AMBULATORY_CARE_PROVIDER_SITE_OTHER): Payer: Medicare Other

## 2024-05-07 ENCOUNTER — Other Ambulatory Visit: Payer: Self-pay

## 2024-05-07 DIAGNOSIS — I480 Paroxysmal atrial fibrillation: Secondary | ICD-10-CM

## 2024-05-07 MED ORDER — APIXABAN 5 MG PO TABS: ORAL_TABLET | ORAL | 1 refills | Status: AC

## 2024-05-07 NOTE — Telephone Encounter (Signed)
 Prescription refill request for Eliquis  received. Indication:afib Last office visit:12/24 Scr:1.80  7/25 Age: 73 Weight:90.7  kg  Prescription refilled

## 2024-05-08 LAB — CUP PACEART REMOTE DEVICE CHECK
Battery Remaining Longevity: 144 mo
Battery Voltage: 3.01 V
Brady Statistic AP VP Percent: 4.44 %
Brady Statistic AP VS Percent: 0.01 %
Brady Statistic AS VP Percent: 48.57 %
Brady Statistic AS VS Percent: 46.98 %
Brady Statistic RA Percent Paced: 4.45 %
Brady Statistic RV Percent Paced: 52.95 %
Date Time Interrogation Session: 20250804220420
Implantable Lead Connection Status: 753985
Implantable Lead Connection Status: 753985
Implantable Lead Implant Date: 20120628
Implantable Lead Implant Date: 20120628
Implantable Lead Location: 753859
Implantable Lead Location: 753860
Implantable Pulse Generator Implant Date: 20230508
Lead Channel Impedance Value: 323 Ohm
Lead Channel Impedance Value: 323 Ohm
Lead Channel Impedance Value: 380 Ohm
Lead Channel Impedance Value: 380 Ohm
Lead Channel Pacing Threshold Amplitude: 0.5 V
Lead Channel Pacing Threshold Amplitude: 0.875 V
Lead Channel Pacing Threshold Pulse Width: 0.4 ms
Lead Channel Pacing Threshold Pulse Width: 0.4 ms
Lead Channel Sensing Intrinsic Amplitude: 3.875 mV
Lead Channel Sensing Intrinsic Amplitude: 3.875 mV
Lead Channel Sensing Intrinsic Amplitude: 5 mV
Lead Channel Sensing Intrinsic Amplitude: 5 mV
Lead Channel Setting Pacing Amplitude: 1.5 V
Lead Channel Setting Pacing Amplitude: 2 V
Lead Channel Setting Pacing Pulse Width: 0.4 ms
Lead Channel Setting Sensing Sensitivity: 1.2 mV
Zone Setting Status: 755011

## 2024-05-09 ENCOUNTER — Ambulatory Visit: Payer: Self-pay | Admitting: Cardiovascular Disease

## 2024-05-31 ENCOUNTER — Telehealth: Payer: Self-pay | Admitting: Cardiovascular Disease

## 2024-05-31 NOTE — Telephone Encounter (Signed)
 Spoke with Nathan Harding who has been having dizziness with position changes for the last several weeks.  When he went for chemo - his BP was too low and it could not be done.  Yesterday he went for the ED DX: rhinovirus - BP was 79/52 HR 78.  He was advised to not take either Lisinopril  or carvedilol.  Today BP 126/73.  Nathan Harding also reports have a lot of problems with diarrhea recently.  Advised to continue to try to stay well hydrated, use caution with all position changes.  Hold medications as instructed until this information is reviewed by Dr Francyne and he is contacted with further instructions.  Nathan Harding states understanding and was appreciative of the call.

## 2024-05-31 NOTE — Telephone Encounter (Signed)
 Pt c/o medication issue:  1. Name of Medication:   carvedilol (COREG) 6.25 MG tablet   lisinopril  (ZESTRIL ) 5 MG tablet  2. How are you currently taking this medication (dosage and times per day)?   Take 6.25 mg by mouth 2 (two) times daily with a meal. - carvedilol    Take 1 tablet (5 mg total) by mouth daily. - lisinopril    3. Are you having a reaction (difficulty breathing--STAT)? No  4. What is your medication issue? Pt went to chemotherapy yesterday and BP was 79/52; pt was unable to receive chemotherapy because BP was too low. Pt went to ER on the same day and the doctor At ER (Dr. Elsie Cave) recommended pt stop taking above medications. Pt is unsure about this and would like to get Dr. Fanny opinion before stopping medications. Please advise.

## 2024-06-04 NOTE — Telephone Encounter (Signed)
 Spoke with patient's wife Nathan Harding, she reports patient is doing better than he was previously. This morning BP/HR were 137/77, HR 69.  Shared response from Dr. JAYSONBETHA Lah, Alm I have seen several folks hospitalized with rhinovirus recently, which usually just causes the common cold. It should pass by itself in a few days.  Stay off the lisinopril . But if your BP allows (consistently 120/60 or higher), please resume carvedilol  at half the previous dose (take 3.125 mg or half of the 6.25 mg tablet), twice daily. The carvedilol  has rhythm maintenance benefits and stopping it abruptly can lead to rebound arrhythmia. If the BP is still low, wait for a day or two until it rebounds. And as you know, hydrate well. Hope you feel better soon, Dr. JAYSON   Patient to resume carvedilol  3.125 mg BID and continue to monitor BP/HR. Nathan Harding verbalized understanding.  Medication list updated.

## 2024-06-04 NOTE — Telephone Encounter (Signed)
 Pt wife called in asking to go over instructions from Dr. Francyne over the phone.

## 2024-06-06 MED ORDER — CARVEDILOL 6.25 MG PO TABS: 6.2500 mg | ORAL_TABLET | Freq: Two times a day (BID) | ORAL | Status: AC

## 2024-06-06 NOTE — Telephone Encounter (Signed)
 Patient's wife following up to provide update. She says patient took half a Carvedilol  as advised. Most recent BP readings were 142/80 and 150/85.

## 2024-06-06 NOTE — Addendum Note (Signed)
 Addended by: GRETEL MAEOLA CROME on: 06/06/2024 11:16 AM   Modules accepted: Orders

## 2024-06-06 NOTE — Telephone Encounter (Signed)
 Called and spoke to wife, shared MD recommendation.  They are agreeable to plan.  Updated MAR

## 2024-06-06 NOTE — Telephone Encounter (Signed)
 Thank you for the update.  With those blood pressure readings I think he can go back to taking the full carvedilol  twice a day.

## 2024-07-01 NOTE — Progress Notes (Signed)
 Remote PPM Transmission

## 2024-08-06 ENCOUNTER — Ambulatory Visit: Payer: Medicare Other

## 2024-08-06 DIAGNOSIS — I480 Paroxysmal atrial fibrillation: Secondary | ICD-10-CM

## 2024-08-06 LAB — CUP PACEART REMOTE DEVICE CHECK
Battery Remaining Longevity: 132 mo
Battery Voltage: 3.01 V
Brady Statistic AP VP Percent: 3.22 %
Brady Statistic AP VS Percent: 0 %
Brady Statistic AS VP Percent: 94.08 %
Brady Statistic AS VS Percent: 2.7 %
Brady Statistic RA Percent Paced: 3.31 %
Brady Statistic RV Percent Paced: 97.3 %
Date Time Interrogation Session: 20251104104929
Implantable Lead Connection Status: 753985
Implantable Lead Connection Status: 753985
Implantable Lead Implant Date: 20120628
Implantable Lead Implant Date: 20120628
Implantable Lead Location: 753859
Implantable Lead Location: 753860
Implantable Pulse Generator Implant Date: 20230508
Lead Channel Impedance Value: 304 Ohm
Lead Channel Impedance Value: 323 Ohm
Lead Channel Impedance Value: 380 Ohm
Lead Channel Impedance Value: 380 Ohm
Lead Channel Pacing Threshold Amplitude: 0.625 V
Lead Channel Pacing Threshold Amplitude: 1.125 V
Lead Channel Pacing Threshold Pulse Width: 0.4 ms
Lead Channel Pacing Threshold Pulse Width: 0.4 ms
Lead Channel Sensing Intrinsic Amplitude: 3 mV
Lead Channel Sensing Intrinsic Amplitude: 3 mV
Lead Channel Sensing Intrinsic Amplitude: 3.25 mV
Lead Channel Sensing Intrinsic Amplitude: 3.25 mV
Lead Channel Setting Pacing Amplitude: 1.5 V
Lead Channel Setting Pacing Amplitude: 2.5 V
Lead Channel Setting Pacing Pulse Width: 0.4 ms
Lead Channel Setting Sensing Sensitivity: 1.2 mV
Zone Setting Status: 755011

## 2024-08-09 NOTE — Progress Notes (Signed)
 Remote PPM Transmission

## 2024-08-10 ENCOUNTER — Ambulatory Visit: Payer: Self-pay | Admitting: Cardiovascular Disease

## 2024-09-20 ENCOUNTER — Telehealth (HOSPITAL_BASED_OUTPATIENT_CLINIC_OR_DEPARTMENT_OTHER): Payer: Self-pay | Admitting: *Deleted

## 2024-09-20 NOTE — Telephone Encounter (Signed)
" ° °  Name: Nathan Harding  DOB: April 05, 1951  MRN: [id number]  Primary Cardiologist: Jerel Balding, MD  Chart reviewed as part of pre-operative protocol coverage. Because of Nathan Harding's past medical history and time since last visit, he will require a follow-up in-office visit in order to better assess preoperative cardiovascular risk.  Pre-op covering staff: - Please schedule appointment and call patient to inform them. If patient already had an upcoming appointment within acceptable timeframe, please add pre-op clearance to the appointment notes so provider is aware. - Please contact requesting surgeon's office via preferred method (i.e, phone, fax) to inform them of need for appointment prior to surgery.  -Routing to device clearance pool.   This message will also be routed to pharmacy pool for input on holding Eliquis  as requested below so that this information is available to the clearing provider at time of patient's appointment.   Mardy KATHEE Pizza, FNP  09/20/2024, 3:52 PM   "

## 2024-09-20 NOTE — Telephone Encounter (Signed)
 OV preop clearance appt now scheduled, pt agrees to appt date and time

## 2024-09-20 NOTE — Telephone Encounter (Signed)
 Overdue to see Dr. Francyne in office >1 year. Wiil need to be seen prior to providing device clearance.

## 2024-09-20 NOTE — Telephone Encounter (Signed)
"  ° °  Pre-operative Risk Assessment    Patient Name: Nathan Harding  DOB: October 30, 1950 MRN: [id number]   Date of last office visit: 09/07/23 DR. CROITORU Date of next office visit: NONE   Request for Surgical Clearance    Procedure:  EXCHANGE IMPLANT URINARY SPHINCTER  Date of Surgery:  Clearance 12/26/24                                Surgeon:  DR. RYAN TERLECKI Surgeon's Group or Practice Name:  [facility] Phone number:  [phone] Fax number:  [phone]   Type of Clearance Requested:   - Medical  - Pharmacy:  Hold Apixaban  (Eliquis ) x 5 DAYS PRIOR    Type of Anesthesia:  General    Additional requests/questions:    Nathan Harding   09/20/2024, 3:22 PM   "

## 2024-09-21 ENCOUNTER — Encounter: Payer: Self-pay | Admitting: Cardiovascular Disease

## 2024-09-21 NOTE — Telephone Encounter (Signed)
 He has an appt 12/09/24 w Josefa Beauvais, NP. Can we do his in-office device check same day, please? [place] away. Thanks

## 2024-09-23 NOTE — Telephone Encounter (Signed)
 Helping in preop today.Chart reviewed. Dr. Francyne has already addressed/finalized clearance and routed to surgeon. The patient does have an in person visit scheduled 12/09/24 (in advance of the surgery), will need to address any updates to the clearance if new issues arise at that OV. As below, Dr. Francyne also requested device team also complete in-office device check at that time. Will remove from preop APP box as additional input not needed at this time.

## 2024-10-14 ENCOUNTER — Ambulatory Visit: Admitting: Emergency Medicine

## 2024-10-14 ENCOUNTER — Encounter: Payer: Self-pay | Admitting: Emergency Medicine

## 2024-10-14 ENCOUNTER — Encounter: Payer: Self-pay | Admitting: Cardiovascular Disease

## 2024-10-14 VITALS — BP 108/60 | HR 97 | Ht 70.0 in | Wt 198.0 lb

## 2024-10-14 DIAGNOSIS — Z950 Presence of cardiac pacemaker: Secondary | ICD-10-CM | POA: Diagnosis not present

## 2024-10-14 DIAGNOSIS — Z01810 Encounter for preprocedural cardiovascular examination: Secondary | ICD-10-CM

## 2024-10-14 DIAGNOSIS — I10 Essential (primary) hypertension: Secondary | ICD-10-CM | POA: Diagnosis not present

## 2024-10-14 DIAGNOSIS — I2510 Atherosclerotic heart disease of native coronary artery without angina pectoris: Secondary | ICD-10-CM

## 2024-10-14 DIAGNOSIS — E782 Mixed hyperlipidemia: Secondary | ICD-10-CM

## 2024-10-14 DIAGNOSIS — C9000 Multiple myeloma not having achieved remission: Secondary | ICD-10-CM

## 2024-10-14 DIAGNOSIS — I480 Paroxysmal atrial fibrillation: Secondary | ICD-10-CM | POA: Diagnosis not present

## 2024-10-14 DIAGNOSIS — E119 Type 2 diabetes mellitus without complications: Secondary | ICD-10-CM | POA: Diagnosis not present

## 2024-10-14 DIAGNOSIS — G9001 Carotid sinus syncope: Secondary | ICD-10-CM

## 2024-10-14 NOTE — Progress Notes (Signed)
 PERIOPERATIVE PRESCRIPTION FOR IMPLANTED CARDIAC DEVICE PROGRAMMING  Patient Information: Name:  TEONDRE JAROSZ  DOB:  1951-04-02  MRN:  [id number]  Procedure:  EXCHANGE IMPLANT URINARY SPHINCTER   Date of Surgery:  Clearance 12/26/24                                  Surgeon:  DR. RYAN TERLECKI Surgeon's Group or Practice Name:  [facility] Phone number:  [phone] Fax number:  [phone]   Type of Clearance Requested:   - Medical  - Pharmacy:  Hold Apixaban  (Eliquis ) x 5 DAYS PRIOR    Type of Anesthesia:  General  Device Information:  Clinic EP Physician:  DR. MIHAI CROITORU  Device Type:  Pacemaker Manufacturer and Phone #:  Medtronic: [id number] Pacemaker Dependent?:  No. Date of Last Device Check:  10/14/24 In Clinic  Normal Device Function?:  Yes.    Electrophysiologist's Recommendations:  Have magnet available. Provide continuous ECG monitoring when magnet is used or reprogramming is to be performed.  Procedure should not interfere with device function.  No device programming or magnet placement needed.  Per Device Clinic Standing Orders, Prentice JINNY Silvan, RN  11:30 AM 10/14/2024

## 2024-10-14 NOTE — Progress Notes (Signed)
 " Cardiology Office Note:    Date:  10/14/2024  ID:  Nathan Harding, DOB 02-10-1951, MRN [id number] PCP: Mario Reyes RAMAN, MD  [facility] Providers Cardiologist:  Jerel Balding, MD       Patient Profile:       Chief Complaint: 1 year follow-up and preoperative clearance History of Present Illness:  Nathan Harding is a 74 y.o. 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  Underwent pacemaker generator change on 02/07/2022.  Last seen in clinic by Dr. Balding on 09/07/2023.  He was recently diagnosed with multiple myeloma. He is on treatment with Velcade, dexamethasone and Revlimid.  He was having some symptoms of orthostatic hypotension and lisinopril  was decreased to 5 mg daily.  He underwent echocardiogram on 12/11/2023 with LVEF 55 to 60%, no RWMA, grade 1 DD, RV function and size normal, normal PASP, right atrial size mildly dilated, trivial MR, mild calcification of aortic valve with no aortic stenosis present  He is now pending exchange implant urinary sphincter.  Discussed the use of AI scribe software for clinical note transcription with the patient, who gave verbal consent to proceed.  History of Present Illness Nathan Harding is a 74 year old male with atrial fibrillation and multiple myeloma who presents for pre-operative clearance for urinary sphincter implant exchange.  He is scheduled for urinary sphincter implant exchange on February 10th.  Today patient tells me he is doing fair. He denies any palpitations or episodes of fast heart rates.  He takes Eliquis  for stroke prevention and has no bleeding symptoms.  He is on chemotherapy for multiple myeloma with  significant fatigue, weakness, and unsteady gait that limit activity. He had symptomatic hypotension at the infusion center a few months ago that required an ER visit. Lisinopril  was stopped because of low blood pressure.  He takes rosuvastatin  20 mg daily. He has coronary artery disease with mild LAD plaque noted in 2010 and no current chest pain or discomfort.  He had a pacemaker implanted in 2012 for carotid sinus hypersensitivity-related syncope. He has had no recent syncope, and recent device checks show very low atrial fibrillation burden.  He receives myeloma treatment at an infusion center in [facility].  He tells me that he is near remission with improved disease markers, which may support improved overall status.  He denies chest pains, dyspnea, orthopnea, PND    Review of systems:  Please see the history of present illness. All other systems are reviewed and otherwise negative.       Studies Reviewed:    EKG Interpretation Date/Time:  Monday October 14 2024 10:35:16 EST Ventricular Rate:  97 PR Interval:    QRS Duration:  84 QT Interval:  324 QTC Calculation: 411 R Axis:   6  Text Interpretation: Atrial flutter with variable A-V block Nonspecific T wave abnormality When compared with ECG of 07-Sep-2023 10:15, Atrial flutter has replaced Electronic ventricular pacemaker Confirmed by Rana Dixon [phone]) on 10/14/2024 12:23:31 PM    Echocardiogram 12/11/2023 1. Left ventricular ejection fraction, by estimation, is 55 to 60%. Left  ventricular ejection fraction by 3D volume is 58 %. The left ventricle has  normal function. The left ventricle has no regional wall motion  abnormalities. Left ventricular diastolic   parameters are  consistent with Grade I diastolic dysfunction (impaired  relaxation).   2. Right ventricular systolic function is normal. The right ventricular  size is normal. There is normal pulmonary artery systolic pressure. The  estimated right  ventricular systolic pressure is 24.5 mmHg.   3. Right atrial size was mildly dilated.   4. The mitral valve is normal in structure. Trivial mitral valve  regurgitation. No evidence of mitral stenosis.   5. The aortic valve is tricuspid. There is mild calcification of the  aortic valve. Aortic valve regurgitation is not visualized. No aortic  stenosis is present.   6. The inferior vena cava is normal in size with greater than 50%  respiratory variability, suggesting right atrial pressure of 3 mmHg.    Risk Assessment/Calculations:    CHA2DS2-VASc Score = 4   This indicates a 4.8% annual risk of stroke. The patient's score is based upon: CHF History: 0 HTN History: 1 Diabetes History: 1 Stroke History: 0 Vascular Disease History: 1 Age Score: 1 Gender Score: 0             Physical Exam:   VS:  BP 108/60 (BP Location: Left Arm, Patient Position: Sitting, Cuff Size: Normal)   Pulse 97   Ht 5' 10 (1.778 m)   Wt 198 lb (89.8 kg)   BMI 28.41 kg/m    Wt Readings from Last 3 Encounters:  10/14/24 198 lb (89.8 kg)  09/07/23 200 lb (90.7 kg)  05/23/22 215 lb 6.4 oz (97.7 kg)    GEN: Well nourished, well developed in no acute distress NECK: No JVD; No carotid bruits CARDIAC: RRR, no murmurs, rubs, gallops RESPIRATORY:  Clear to auscultation without rales, wheezing or rhonchi  ABDOMEN: Soft, non-tender, non-distended EXTREMITIES:  No edema; No acute deformity      Assessment and Plan:  Paroxysmal atrial fibrillation Has had a very low prevalence in the past per [employer] review - EKG today did show rate controlled atrial flutter - He is asymptomatic today without palpitations, chest discomfort, or tachycardia.  Does experience fatigue with history of multiple myeloma that is unchanged - In office PPM interrogation showed episode of PAF occurred this morning and he self converted back to sinus rhythm while sitting in office - After device interrogation Dr. Francyne reviewed  who recommended to reprogram to AAI-DDD, rather than DDD which was completed by device team today - He has had no episodes of RVR while in PAF and appropriately anticoagulated.  Will continue current medication regimen - Continue Eliquis  5 mg twice daily, no bleeding concerns - Continue carvedilol  6.25 mg twice daily  Hypertension Blood pressure is well-controlled at 108/60 Was previously on lisinopril  but discontinued due to symptomatic hypotension several months ago without recurrent episodes - Continue carvedilol  6.25 mg twice daily  Hyperlipidemia LDL is currently within target range - Encouraged daily physical exercise and weight loss - Continue rosuvastatin  20 mg daily  T2DM A1c 7.1% on 08/2024 not well-controlled and currently worsened with treatment for multiple myeloma - Managed on metformin and glimepiride per PCP  Coronary artery disease Mild nonobstructive LAD atherosclerosis seen at time of remote cardiac catheterization in 2010 - Today he is stable without chest chest pains or exertional symptoms.  No symptoms to suggest angina.  No indication for further ischemic evaluation at this time - Continue rosuvastatin  20 mg daily  Carotid sinus hypersensitivity S/p PPM in 2012 - He remains without syncope or near syncope since his pacemaker was implanted No interventions warranted at this time-  Multiple myeloma TTE 12/2023 showed normal LV function with EF of 55 to 60% and normal RV - Currently managed on chemotherapy without significant cardiovascular adverse effects - His recent markers have shown improvement but not yet reached remission - Management per oncology  Preoperative cardiovascular clearance Exchange implant urinary sphincter on 11/12/2024 with [facility] urology  According to the Revised Cardiac Risk Index (RCRI), his Perioperative Risk of Major Cardiac Event is (%): 0.4. His Functional Capacity in METs is: 5.62 according to the [facility] Activity Status Index  (DASI).  Therefore, based on ACC/AHA guidelines, patient would be at acceptable risk for the planned procedure without further cardiovascular testing. I will route this recommendation to the requesting party via Epic fax function.  Stop Eliquis  for 3 days before surgery and restart as soon as the surgeon says it is safe.        Dispo:  Return in about 3 months (around 01/12/2025).  Signed, Nathan LITTIE Louis, NP  "

## 2024-10-14 NOTE — Patient Instructions (Addendum)
 Medication Instructions:  NO CHANGES  Lab Work: NONE TO BE DONE TODAY.  Testing/Procedures: NONE  Follow-Up: At [facility], you and your health needs are our priority.  As part of our continuing mission to provide you with exceptional heart care, our providers are all part of one team.  This team includes your primary Cardiologist (physician) and Advanced Practice Providers or APPs (Physician Assistants and Nurse Practitioners) who all work together to provide you with the care you need, when you need it.  Your next appointment:   3 MONTHS  Provider:   Jerel Balding, MD     Other Instructions:

## 2024-11-05 ENCOUNTER — Ambulatory Visit: Payer: Medicare Other

## 2024-11-05 LAB — CUP PACEART REMOTE DEVICE CHECK
Battery Remaining Longevity: 121 mo
Battery Voltage: 3.02 V
Brady Statistic AP VP Percent: 2 %
Brady Statistic AP VS Percent: 2.73 %
Brady Statistic AS VP Percent: 0.08 %
Brady Statistic AS VS Percent: 95.19 %
Brady Statistic RA Percent Paced: 4.69 %
Brady Statistic RV Percent Paced: 2.1 %
Date Time Interrogation Session: 20260202202259
Implantable Lead Connection Status: 753985
Implantable Lead Connection Status: 753985
Implantable Lead Implant Date: 20120628
Implantable Lead Implant Date: 20120628
Implantable Lead Location: 753859
Implantable Lead Location: 753860
Implantable Pulse Generator Implant Date: 20230508
Lead Channel Impedance Value: 304 Ohm
Lead Channel Impedance Value: 323 Ohm
Lead Channel Impedance Value: 361 Ohm
Lead Channel Impedance Value: 361 Ohm
Lead Channel Pacing Threshold Amplitude: 0.625 V
Lead Channel Pacing Threshold Amplitude: 1.375 V
Lead Channel Pacing Threshold Pulse Width: 0.4 ms
Lead Channel Pacing Threshold Pulse Width: 0.4 ms
Lead Channel Sensing Intrinsic Amplitude: 1.625 mV
Lead Channel Sensing Intrinsic Amplitude: 1.625 mV
Lead Channel Sensing Intrinsic Amplitude: 2.5 mV
Lead Channel Sensing Intrinsic Amplitude: 2.5 mV
Lead Channel Setting Pacing Amplitude: 1.5 V
Lead Channel Setting Pacing Amplitude: 2.75 V
Lead Channel Setting Pacing Pulse Width: 0.4 ms
Lead Channel Setting Sensing Sensitivity: 1.2 mV
Zone Setting Status: 755011

## 2024-11-07 ENCOUNTER — Ambulatory Visit: Payer: Self-pay | Admitting: Cardiovascular Disease

## 2024-12-09 ENCOUNTER — Ambulatory Visit: Admitting: General Practice

## 2025-01-21 ENCOUNTER — Ambulatory Visit: Admitting: Cardiovascular Disease

## 2025-02-04 ENCOUNTER — Ambulatory Visit

## 2025-05-06 ENCOUNTER — Ambulatory Visit

## 2025-08-05 ENCOUNTER — Ambulatory Visit

## 2025-11-04 ENCOUNTER — Ambulatory Visit

## 2026-02-03 ENCOUNTER — Ambulatory Visit

## 2026-05-05 ENCOUNTER — Ambulatory Visit

## 2026-08-04 ENCOUNTER — Ambulatory Visit

## 2026-11-03 ENCOUNTER — Ambulatory Visit
